# Patient Record
Sex: Male | Born: 1970 | Hispanic: Yes | Marital: Single | Smoking: Former smoker
Health system: Southern US, Community
[De-identification: ages and names within clinical notes are randomized; demographics above are authoritative.]

---

## 2013-12-31 ENCOUNTER — Ambulatory Visit: Payer: Self-pay | Admitting: Family Medicine

## 2013-12-31 VITALS — BP 128/74 | HR 82 | Temp 98.7°F | Resp 16 | Ht 66.0 in | Wt 125.0 lb

## 2013-12-31 DIAGNOSIS — H109 Unspecified conjunctivitis: Secondary | ICD-10-CM

## 2013-12-31 DIAGNOSIS — H579 Unspecified disorder of eye and adnexa: Secondary | ICD-10-CM

## 2013-12-31 DIAGNOSIS — H5789 Other specified disorders of eye and adnexa: Secondary | ICD-10-CM

## 2013-12-31 MED ORDER — OFLOXACIN 0.3 % OP SOLN: 2.0000 [drp] | Freq: Four times a day (QID) | OPHTHALMIC | Status: DC

## 2013-12-31 NOTE — Progress Notes (Signed)
      Chief Complaint:  Chief Complaint  Patient presents with  . eye redness    2 days     HPI: Dan Mann is a 43 y.o. male who is here for  2 day history of right eye redness and pain in the upper right area of eye near the nasal bridge, something flew in his eye after sorting out trash. NO protective eye gear. Small debris may have gotten in his eye, he is not sure.  He has some irritation and redness but no visions changes or excruciating pain, just discomfort. He has had no fevers. No chills. Has not tried anythign for this. No prior eye injuries. Works in  [employer] . Denies rashes or facial pain. Dan Mann has been clear. No swelling.   History reviewed. No pertinent past medical history. History reviewed. No pertinent past surgical history. History   Social History  . Marital Status: Married    Spouse Name: N/A    Number of Children: N/A  . Years of Education: N/A   Social History Main Topics  . Smoking status: Never Smoker   . Smokeless tobacco: None  . Alcohol Use: No  . Drug Use: No  . Sexual Activity: None   Other Topics Concern  . None   Social History Narrative  . None   History reviewed. No pertinent family history. No Known Allergies Prior to Admission medications   Not on File     ROS: The patient denies fevers, chills, night sweats, unintentional weight loss, chest pain, palpitations, wheezing, dyspnea on exertion, nausea, vomiting, abdominal pain, dysuria, hematuria, melena, numbness, weakness, or tingling.   All other systems have been reviewed and were otherwise negative with the exception of those mentioned in the HPI and as above.    PHYSICAL EXAM: Filed Vitals:   12/31/13 1547  BP: 128/74  Pulse: 82  Temp: 98.7 F (37.1 C)  Resp: 16   Filed Vitals:   12/31/13 1547  Height: 5\' 6"  (1.676 m)  Weight: 125 lb (56.7 kg)   Body mass index is 20.19 kg/(m^2).  General: Alert, no acute distress HEENT:  Normocephalic,  atraumatic, oropharynx patent. EOMI, PERRLA. Fundoscopic and fluroscein exam was normal . + red conjunctiva Cardiovascular:  Regular rate and rhythm, no rubs murmurs or gallops.  No Carotid bruits, radial pulse intact. No pedal edema.  Respiratory: Clear to auscultation bilaterally.  No wheezes, rales, or rhonchi.  No cyanosis, no use of accessory musculature GI: No organomegaly, abdomen is soft and non-tender, positive bowel sounds.  No masses. Skin: No rashes. Neurologic: Facial musculature symmetric. Psychiatric: Patient is appropriate throughout our interaction. Lymphatic: No cervical lymphadenopathy Musculoskeletal: Gait intact.   LABS: No results found for this or any previous visit.   EKG/XRAY:   Primary read [facility]   ASSESSMENT/PLAN: Encounter Diagnoses  Name Primary?  . Irritation of right eye Yes  . Conjunctivitis    Rx ocuflox QID  X 7 days Eye was flushed with sterile saline, given ocuflox and 2 drops of prednisone gtt in office.  Return if worsening sxs Fundoscopic and fluroscein exam was normal   Gross sideeffects, risk and benefits, and alternatives of medications d/w patient. Patient is aware that all medications have potential sideeffects and we are unable to predict every sideeffect or drug-drug interaction that may occur.  Dan Mann, Dan Lillibridge PHUONG, DO 12/31/2013 5:15 PM

## 2014-07-10 ENCOUNTER — Ambulatory Visit: Payer: No Typology Code available for payment source | Attending: Family Medicine | Admitting: Family Medicine

## 2014-07-10 ENCOUNTER — Encounter: Payer: Self-pay | Admitting: Family Medicine

## 2014-07-10 VITALS — BP 112/70 | HR 68 | Temp 98.2°F | Resp 16 | Ht 63.0 in | Wt 128.0 lb

## 2014-07-10 DIAGNOSIS — Z7289 Other problems related to lifestyle: Secondary | ICD-10-CM

## 2014-07-10 DIAGNOSIS — Z789 Other specified health status: Secondary | ICD-10-CM | POA: Insufficient documentation

## 2014-07-10 DIAGNOSIS — Z23 Encounter for immunization: Secondary | ICD-10-CM

## 2014-07-10 DIAGNOSIS — K029 Dental caries, unspecified: Secondary | ICD-10-CM

## 2014-07-10 DIAGNOSIS — Z833 Family history of diabetes mellitus: Secondary | ICD-10-CM

## 2014-07-10 DIAGNOSIS — Z0000 Encounter for general adult medical examination without abnormal findings: Secondary | ICD-10-CM | POA: Insufficient documentation

## 2014-07-10 DIAGNOSIS — A64 Unspecified sexually transmitted disease: Secondary | ICD-10-CM

## 2014-07-10 HISTORY — DX: Family history of diabetes mellitus: Z83.3

## 2014-07-10 LAB — COMPLETE METABOLIC PANEL WITH GFR
ALBUMIN: 4.5 g/dL (ref 3.5–5.2)
ALK PHOS: 76 U/L (ref 39–117)
ALT: 14 U/L (ref 0–53)
AST: 15 U/L (ref 0–37)
BILIRUBIN TOTAL: 0.4 mg/dL (ref 0.2–1.2)
BUN: 19 mg/dL (ref 6–23)
CO2: 29 mEq/L (ref 19–32)
Calcium: 9.6 mg/dL (ref 8.4–10.5)
Chloride: 101 mEq/L (ref 96–112)
Creat: 0.82 mg/dL (ref 0.50–1.35)
GFR, Est African American: >89 mL/min
Glucose, Bld: 81 mg/dL (ref 70–99)
POTASSIUM: 4.8 meq/L (ref 3.5–5.3)
SODIUM: 137 meq/L (ref 135–145)
Total Protein: 7.6 g/dL (ref 6.0–8.3)

## 2014-07-10 LAB — CBC
HEMATOCRIT: 45 % (ref 39.0–52.0)
HEMOGLOBIN: 15.9 g/dL (ref 13.0–17.0)
MCH: 29.3 pg (ref 26.0–34.0)
MCHC: 35.3 g/dL (ref 30.0–36.0)
MCV: 83 fL (ref 78.0–100.0)
Platelets: 226 10*3/uL (ref 150–400)
RBC: 5.42 MIL/uL (ref 4.22–5.81)
RDW: 14 % (ref 11.5–15.5)
WBC: 7 10*3/uL (ref 4.0–10.5)

## 2014-07-10 NOTE — Assessment & Plan Note (Signed)
A: excessive in take of beer P: Recommend cutting down Check CMP and CBC

## 2014-07-10 NOTE — Assessment & Plan Note (Signed)
Poor dentition with cavities Dental referral placed

## 2014-07-10 NOTE — Progress Notes (Signed)
   Subjective:    Patient ID: Dan Mann, male    DOB: 02-15-1971, 43 y.o.   MRN: [id number] CC: establish care, no complaints.  HPI 43 year old male presents to establish care and for a physical.  Complaints: none   Social history: former smoker  Review of Systems General:  Negative for unexplained weight loss, fever Skin: Negative for new or changing mole, sore that won't heal HEENT: Negative for trouble hearing, trouble seeing, ringing in ears, mouth sores, hoarseness, change in voice, dysphagia. CV:  Negative for chest pain, dyspnea, edema, palpitations Resp: Negative for cough, dyspnea, hemoptysis GI: Negative for nausea, vomiting, diarrhea, constipation, abdominal pain, melena, hematochezia. GU: Negative for dysuria, incontinence, urinary hesitance, hematuria, vaginal or penile discharge, polyuria, sexual difficulty, lumps in testicle or breasts MSK: Positive for muscle aches in back and upper chest occasionally.  Negative for muscle cramps , joint pain or swelling Neuro: Negative for headaches, weakness, numbness, dizziness, passing out/fainting Psych: Negative for depression, anxiety, memory problems    Objective:   Physical Exam BP 112/70  Pulse 68  Temp(Src) 98.2 F (36.8 C) (Oral)  Resp 16  Ht  (1.6 m)  Wt 128 lb (58.06 kg)  BMI 22.68 kg/m2  SpO2 100%  General Appearance:    Alert, cooperative, no distress, appears stated age  Head:    Normocephalic, without obvious abnormality, atraumatic  Eyes:    PERRL, conjunctiva/corneas clear, EOM's intact,     both eyes       Ears:    Normal TM's and external ear canals, both ears  Nose:   Nares normal, septum midline, mucosa normal, no drainage   or sinus tenderness  Throat:   Poor dentition with carries.  Lips, mucosa, and tongue normal; gums normal  Neck:   Supple, symmetrical, trachea midline, no adenopathy;       thyroid:  No enlargement/tenderness/nodules; no carotid   bruit or JVD  Back:      Symmetric, no curvature, ROM normal, no CVA tenderness  Lungs:     Clear to auscultation bilaterally, respirations unlabored  Chest wall:    No tenderness or deformity  Heart:    Regular rate and rhythm, S1 and S2 normal, no murmur, rub   or gallop  Abdomen:     Soft, non-tender, bowel sounds active all four quadrants,    no masses, no organomegaly  Genitalia:    Normal male without lesion, discharge or tenderness. Circumcised.   Rectal:    Deferred   Extremities:   Extremities normal, atraumatic, no cyanosis or edema  Pulses:   2+ and symmetric all extremities  Skin:   Skin color, texture, turgor normal, no rashes or lesions  Lymph nodes:   Cervical, supraclavicular, and axillary nodes normal  Neurologic:   CNII-XII intact. Normal strength, sensation and reflexes      throughout      Assessment & Plan:

## 2014-07-10 NOTE — Assessment & Plan Note (Signed)
CMP for screening blood sugar

## 2014-07-10 NOTE — Patient Instructions (Signed)
Mr. Noland, Pizano por venido hoy.  Thank you for coming in today. It was a pleasure meeting you. I look forward to be a primary doctor.   Your exam is normal. You will be called with lab results.  I recommend a physical every 1-2 years.  Call and come in sooner if needed.  Dr. Armen Pickup

## 2014-07-10 NOTE — Assessment & Plan Note (Signed)
One time HIV

## 2014-07-10 NOTE — Progress Notes (Signed)
Establish Care;

## 2014-07-11 LAB — HIV ANTIBODY (ROUTINE TESTING W REFLEX): HIV: NONREACTIVE

## 2014-07-12 ENCOUNTER — Telehealth: Payer: Self-pay | Admitting: *Deleted

## 2014-07-12 NOTE — Telephone Encounter (Signed)
Left message with male to return call 

## 2014-07-12 NOTE — Telephone Encounter (Signed)
Message copied by Dyann Kief on Fri Jul 12, 2014  2:40 PM ------      Message from: Dessa Phi      Created: Thu Jul 11, 2014  4:10 PM       Screening HIV negative. Normal CBC and CMP. ------

## 2014-07-12 NOTE — Telephone Encounter (Signed)
Message copied by Dyann Kief on Fri Jul 12, 2014  2:35 PM ------      Message from: Dessa Phi      Created: Thu Jul 11, 2014  4:10 PM       Screening HIV negative. Normal CBC and CMP. ------

## 2014-07-16 ENCOUNTER — Telehealth: Payer: Self-pay | Admitting: Emergency Medicine

## 2014-07-16 ENCOUNTER — Telehealth: Payer: Self-pay | Admitting: Family Medicine

## 2014-07-16 NOTE — Telephone Encounter (Signed)
Pt given lab results in person with print out per Spanish intepretor

## 2014-12-13 ENCOUNTER — Ambulatory Visit: Payer: Self-pay

## 2015-05-15 ENCOUNTER — Encounter (HOSPITAL_COMMUNITY): Payer: Self-pay | Admitting: Emergency Medicine

## 2015-05-15 DIAGNOSIS — K353 Acute appendicitis with localized peritonitis: Secondary | ICD-10-CM | POA: Diagnosis present

## 2015-05-15 DIAGNOSIS — K3580 Unspecified acute appendicitis: Principal | ICD-10-CM | POA: Insufficient documentation

## 2015-05-15 LAB — CBC WITH DIFFERENTIAL/PLATELET
BASOS PCT: 0 % (ref 0–1)
Basophils Absolute: 0 10*3/uL (ref 0.0–0.1)
Eosinophils Absolute: 0 10*3/uL (ref 0.0–0.7)
Eosinophils Relative: 0 % (ref 0–5)
HEMATOCRIT: 40.2 % (ref 39.0–52.0)
Hemoglobin: 14.1 g/dL (ref 13.0–17.0)
Lymphocytes Relative: 12 % (ref 12–46)
Lymphs Abs: 1.5 10*3/uL (ref 0.7–4.0)
MCH: 28.7 pg (ref 26.0–34.0)
MCHC: 35.1 g/dL (ref 30.0–36.0)
MCV: 81.9 fL (ref 78.0–100.0)
MONO ABS: 0.9 10*3/uL (ref 0.1–1.0)
Monocytes Relative: 8 % (ref 3–12)
NEUTROS PCT: 80 % — AB (ref 43–77)
Neutro Abs: 9.8 10*3/uL — ABNORMAL HIGH (ref 1.7–7.7)
Platelets: 190 10*3/uL (ref 150–400)
RBC: 4.91 MIL/uL (ref 4.22–5.81)
RDW: 12.9 % (ref 11.5–15.5)
WBC: 12.2 10*3/uL — ABNORMAL HIGH (ref 4.0–10.5)

## 2015-05-15 LAB — I-STAT CHEM 8, ED
BUN: 18 mg/dL (ref 6–20)
CHLORIDE: 99 mmol/L — AB (ref 101–111)
Calcium, Ion: 1.14 mmol/L (ref 1.12–1.23)
Creatinine, Ser: 1.1 mg/dL (ref 0.61–1.24)
GLUCOSE: 93 mg/dL (ref 65–99)
HCT: 44 % (ref 39.0–52.0)
HEMOGLOBIN: 15 g/dL (ref 13.0–17.0)
Potassium: 3.4 mmol/L — ABNORMAL LOW (ref 3.5–5.1)
Sodium: 139 mmol/L (ref 135–145)
TCO2: 26 mmol/L (ref 0–100)

## 2015-05-15 NOTE — ED Notes (Signed)
Pt c/o pain at umbilicus, onset this am with nausea and vomiting.  Denies diarrhea

## 2015-05-16 ENCOUNTER — Emergency Department (HOSPITAL_COMMUNITY): Payer: Medicaid Other

## 2015-05-16 ENCOUNTER — Encounter (HOSPITAL_COMMUNITY): Payer: Self-pay | Admitting: *Deleted

## 2015-05-16 ENCOUNTER — Encounter (HOSPITAL_COMMUNITY)
Admission: EM | Disposition: A | Discharge status: Home / Self Care | Payer: Self-pay | Source: Home / Self Care | Attending: Emergency Medicine

## 2015-05-16 ENCOUNTER — Observation Stay (HOSPITAL_COMMUNITY)
Admission: EM | Admit: 2015-05-16 | Discharge: 2015-05-17 | Disposition: A | Discharge status: Home / Self Care | Payer: Medicaid Other | Attending: Surgery | Admitting: Surgery

## 2015-05-16 ENCOUNTER — Ambulatory Visit: Payer: Self-pay | Admitting: Family Medicine

## 2015-05-16 ENCOUNTER — Observation Stay (HOSPITAL_COMMUNITY): Payer: Medicaid Other | Admitting: Anesthesiology

## 2015-05-16 DIAGNOSIS — K3580 Unspecified acute appendicitis: Secondary | ICD-10-CM | POA: Diagnosis present

## 2015-05-16 DIAGNOSIS — K353 Acute appendicitis with localized peritonitis, without perforation or gangrene: Secondary | ICD-10-CM

## 2015-05-16 HISTORY — PX: LAPAROSCOPIC APPENDECTOMY: SHX408

## 2015-05-16 LAB — URINALYSIS, ROUTINE W REFLEX MICROSCOPIC
Bilirubin Urine: NEGATIVE
GLUCOSE, UA: NEGATIVE mg/dL
Hgb urine dipstick: NEGATIVE
Ketones, ur: NEGATIVE mg/dL
Leukocytes, UA: NEGATIVE
NITRITE: NEGATIVE
PROTEIN: NEGATIVE mg/dL
SPECIFIC GRAVITY, URINE: 1.013 (ref 1.005–1.030)
UROBILINOGEN UA: 0.2 mg/dL (ref 0.0–1.0)
pH: 6 (ref 5.0–8.0)

## 2015-05-16 LAB — SURGICAL PCR SCREEN
MRSA, PCR: NEGATIVE
Staphylococcus aureus: POSITIVE — AB

## 2015-05-16 SURGERY — APPENDECTOMY, LAPAROSCOPIC
Anesthesia: General | Site: Abdomen

## 2015-05-16 MED ORDER — LIDOCAINE HCL (CARDIAC) 20 MG/ML IV SOLN
INTRAVENOUS | Status: DC | PRN
  Administered 2015-05-16: 100 mg via INTRAVENOUS

## 2015-05-16 MED ORDER — MORPHINE SULFATE 2 MG/ML IJ SOLN
1.0000 mg | INTRAMUSCULAR | Status: DC | PRN
  Administered 2015-05-16: 2 mg via INTRAVENOUS
  Administered 2015-05-16: 4 mg via INTRAVENOUS
  Filled 2015-05-16: qty 2
  Filled 2015-05-16: qty 1

## 2015-05-16 MED ORDER — ONDANSETRON HCL 4 MG/2ML IJ SOLN
4.0000 mg | Freq: Four times a day (QID) | INTRAMUSCULAR | Status: DC | PRN
  Filled 2015-05-16 (×2): qty 2

## 2015-05-16 MED ORDER — SUCCINYLCHOLINE CHLORIDE 20 MG/ML IJ SOLN
INTRAMUSCULAR | Status: DC | PRN
  Administered 2015-05-16: 160 mg via INTRAVENOUS

## 2015-05-16 MED ORDER — NEOSTIGMINE METHYLSULFATE 10 MG/10ML IV SOLN
INTRAVENOUS | Status: DC | PRN
  Administered 2015-05-16: 3 mg via INTRAVENOUS

## 2015-05-16 MED ORDER — FENTANYL CITRATE (PF) 100 MCG/2ML IJ SOLN
INTRAMUSCULAR | Status: DC | PRN
  Administered 2015-05-16: 100 ug via INTRAVENOUS

## 2015-05-16 MED ORDER — ROCURONIUM BROMIDE 50 MG/5ML IV SOLN
INTRAVENOUS | Status: AC
  Filled 2015-05-16: qty 1

## 2015-05-16 MED ORDER — PROPOFOL 10 MG/ML IV BOLUS
INTRAVENOUS | Status: DC | PRN
  Administered 2015-05-16: 180 mg via INTRAVENOUS

## 2015-05-16 MED ORDER — BETHANECHOL CHLORIDE 25 MG PO TABS
25.0000 mg | ORAL_TABLET | Freq: Four times a day (QID) | ORAL | Status: DC
  Administered 2015-05-16 – 2015-05-17 (×4): 25 mg via ORAL
  Filled 2015-05-16 (×4): qty 1

## 2015-05-16 MED ORDER — PHENYLEPHRINE HCL 10 MG/ML IJ SOLN
INTRAMUSCULAR | Status: DC | PRN
  Administered 2015-05-16 (×4): 40 ug via INTRAVENOUS

## 2015-05-16 MED ORDER — MIDAZOLAM HCL 2 MG/2ML IJ SOLN
INTRAMUSCULAR | Status: AC
  Filled 2015-05-16: qty 2

## 2015-05-16 MED ORDER — POTASSIUM CHLORIDE IN NACL 20-0.9 MEQ/L-% IV SOLN
INTRAVENOUS | Status: DC
  Administered 2015-05-16 (×2): via INTRAVENOUS
  Filled 2015-05-16: qty 2000

## 2015-05-16 MED ORDER — FENTANYL CITRATE (PF) 250 MCG/5ML IJ SOLN
INTRAMUSCULAR | Status: AC
  Filled 2015-05-16: qty 5

## 2015-05-16 MED ORDER — BUPIVACAINE HCL 0.25 % IJ SOLN
INTRAMUSCULAR | Status: DC | PRN
  Administered 2015-05-16: 5 mL

## 2015-05-16 MED ORDER — 0.9 % SODIUM CHLORIDE (POUR BTL) OPTIME
TOPICAL | Status: DC | PRN
  Administered 2015-05-16: 1000 mL

## 2015-05-16 MED ORDER — LIDOCAINE HCL (CARDIAC) 20 MG/ML IV SOLN
INTRAVENOUS | Status: AC
  Filled 2015-05-16: qty 5

## 2015-05-16 MED ORDER — OXYCODONE-ACETAMINOPHEN 5-325 MG PO TABS: 1.0000 | ORAL_TABLET | ORAL | Status: DC | PRN

## 2015-05-16 MED ORDER — ONDANSETRON HCL 4 MG PO TABS: 4.0000 mg | ORAL_TABLET | Freq: Four times a day (QID) | ORAL | Status: DC | PRN

## 2015-05-16 MED ORDER — SODIUM CHLORIDE 0.9 % IV BOLUS (SEPSIS)
1000.0000 mL | Freq: Once | INTRAVENOUS | Status: AC
  Administered 2015-05-16: 1000 mL via INTRAVENOUS

## 2015-05-16 MED ORDER — PIPERACILLIN-TAZOBACTAM 3.375 G IVPB
3.3750 g | Freq: Three times a day (TID) | INTRAVENOUS | Status: DC
  Administered 2015-05-16: 3.375 g via INTRAVENOUS
  Filled 2015-05-16 (×3): qty 50

## 2015-05-16 MED ORDER — GLYCOPYRROLATE 0.2 MG/ML IJ SOLN
INTRAMUSCULAR | Status: AC
  Filled 2015-05-16: qty 2

## 2015-05-16 MED ORDER — MORPHINE SULFATE 4 MG/ML IJ SOLN
4.0000 mg | Freq: Once | INTRAMUSCULAR | Status: AC
  Administered 2015-05-16: 4 mg via INTRAVENOUS
  Filled 2015-05-16: qty 1

## 2015-05-16 MED ORDER — LACTATED RINGERS IV SOLN
INTRAVENOUS | Status: DC | PRN
  Administered 2015-05-16 (×2): via INTRAVENOUS

## 2015-05-16 MED ORDER — POTASSIUM CHLORIDE IN NACL 20-0.9 MEQ/L-% IV SOLN
INTRAVENOUS | Status: DC
  Administered 2015-05-16: 125 mL/h via INTRAVENOUS
  Filled 2015-05-16: qty 1000

## 2015-05-16 MED ORDER — IOHEXOL 300 MG/ML  SOLN
100.0000 mL | Freq: Once | INTRAMUSCULAR | Status: AC | PRN
Start: 2015-05-16 — End: 2015-05-16
  Administered 2015-05-16: 100 mL via INTRAVENOUS

## 2015-05-16 MED ORDER — ONDANSETRON HCL 4 MG/2ML IJ SOLN
INTRAMUSCULAR | Status: DC | PRN
  Administered 2015-05-16: 4 mg via INTRAVENOUS

## 2015-05-16 MED ORDER — SODIUM CHLORIDE 0.9 % IR SOLN
Status: DC | PRN
  Administered 2015-05-16: 1000 mL

## 2015-05-16 MED ORDER — ONDANSETRON HCL 4 MG/2ML IJ SOLN
4.0000 mg | Freq: Four times a day (QID) | INTRAMUSCULAR | Status: DC | PRN
  Administered 2015-05-16 – 2015-05-17 (×2): 4 mg via INTRAVENOUS

## 2015-05-16 MED ORDER — BUPIVACAINE HCL (PF) 0.25 % IJ SOLN
INTRAMUSCULAR | Status: AC
  Filled 2015-05-16: qty 30

## 2015-05-16 MED ORDER — GLYCOPYRROLATE 0.2 MG/ML IJ SOLN
INTRAMUSCULAR | Status: DC | PRN
  Administered 2015-05-16: 0.4 mg via INTRAVENOUS

## 2015-05-16 MED ORDER — OXYCODONE-ACETAMINOPHEN 5-325 MG PO TABS
1.0000 | ORAL_TABLET | ORAL | Status: DC | PRN
  Administered 2015-05-16 – 2015-05-17 (×3): 2 via ORAL
  Filled 2015-05-16 (×3): qty 2

## 2015-05-16 MED ORDER — PROMETHAZINE HCL 25 MG/ML IJ SOLN
6.2500 mg | INTRAMUSCULAR | Status: DC | PRN
Start: 2015-05-16 — End: 2015-05-16

## 2015-05-16 MED ORDER — ROCURONIUM BROMIDE 100 MG/10ML IV SOLN
INTRAVENOUS | Status: DC | PRN
  Administered 2015-05-16: 5 mg via INTRAVENOUS
  Administered 2015-05-16 (×2): 10 mg via INTRAVENOUS

## 2015-05-16 MED ORDER — MIDAZOLAM HCL 5 MG/5ML IJ SOLN
INTRAMUSCULAR | Status: DC | PRN
  Administered 2015-05-16: 1 mg via INTRAVENOUS

## 2015-05-16 MED ORDER — TAMSULOSIN HCL 0.4 MG PO CAPS
0.8000 mg | ORAL_CAPSULE | Freq: Every day | ORAL | Status: DC
  Administered 2015-05-17: 0.8 mg via ORAL
  Filled 2015-05-16: qty 2

## 2015-05-16 MED ORDER — SUCCINYLCHOLINE CHLORIDE 20 MG/ML IJ SOLN
INTRAMUSCULAR | Status: AC
  Filled 2015-05-16: qty 1

## 2015-05-16 MED ORDER — HYDROMORPHONE HCL 1 MG/ML IJ SOLN
INTRAMUSCULAR | Status: AC
  Filled 2015-05-16: qty 1

## 2015-05-16 MED ORDER — HYDROMORPHONE HCL 1 MG/ML IJ SOLN
0.2500 mg | INTRAMUSCULAR | Status: DC | PRN
  Administered 2015-05-16: 0.5 mg via INTRAVENOUS

## 2015-05-16 MED ORDER — PROPOFOL 10 MG/ML IV BOLUS
INTRAVENOUS | Status: AC
  Filled 2015-05-16: qty 20

## 2015-05-16 SURGICAL SUPPLY — 44 items
APPLIER CLIP 5 13 M/L LIGAMAX5 (MISCELLANEOUS)
BENZOIN TINCTURE PRP APPL 2/3 (GAUZE/BANDAGES/DRESSINGS) ×3 IMPLANT
BLADE SURG ROTATE 9660 (MISCELLANEOUS) ×3 IMPLANT
CANISTER SUCTION 2500CC (MISCELLANEOUS) ×3 IMPLANT
CHLORAPREP W/TINT 26ML (MISCELLANEOUS) ×3 IMPLANT
CLIP APPLIE 5 13 M/L LIGAMAX5 (MISCELLANEOUS) IMPLANT
CLOSURE WOUND 1/2 X4 (GAUZE/BANDAGES/DRESSINGS) ×1
COVER SURGICAL LIGHT HANDLE (MISCELLANEOUS) ×3 IMPLANT
COVER TRANSDUCER ULTRASND (DRAPES) ×3 IMPLANT
DEVICE TROCAR PUNCTURE CLOSURE (ENDOMECHANICALS) ×3 IMPLANT
ELECT REM PT RETURN 9FT ADLT (ELECTROSURGICAL) ×3
ELECTRODE REM PT RTRN 9FT ADLT (ELECTROSURGICAL) ×1 IMPLANT
ENDOLOOP SUT PDS II  0 18 (SUTURE) ×6
ENDOLOOP SUT PDS II 0 18 (SUTURE) ×3 IMPLANT
GAUZE SPONGE 2X2 8PLY STRL LF (GAUZE/BANDAGES/DRESSINGS) ×1 IMPLANT
GLOVE BIO SURGEON STRL SZ7.5 (GLOVE) ×3 IMPLANT
GLOVE BIOGEL PI IND STRL 6.5 (GLOVE) ×1 IMPLANT
GLOVE BIOGEL PI INDICATOR 6.5 (GLOVE) ×2
GOWN STRL REUS W/ TWL LRG LVL3 (GOWN DISPOSABLE) ×2 IMPLANT
GOWN STRL REUS W/ TWL XL LVL3 (GOWN DISPOSABLE) ×1 IMPLANT
GOWN STRL REUS W/TWL LRG LVL3 (GOWN DISPOSABLE) ×4
GOWN STRL REUS W/TWL XL LVL3 (GOWN DISPOSABLE) ×2
KIT BASIN OR (CUSTOM PROCEDURE TRAY) ×3 IMPLANT
KIT ROOM TURNOVER OR (KITS) ×3 IMPLANT
NEEDLE INSUFFLATION 14GA 120MM (NEEDLE) ×3 IMPLANT
NS IRRIG 1000ML POUR BTL (IV SOLUTION) ×3 IMPLANT
PAD ARMBOARD 7.5X6 YLW CONV (MISCELLANEOUS) ×3 IMPLANT
POUCH RETRIEVAL ECOSAC 10 (ENDOMECHANICALS) ×1 IMPLANT
POUCH RETRIEVAL ECOSAC 10MM (ENDOMECHANICALS) ×2
SCISSORS LAP 5X35 DISP (ENDOMECHANICALS) ×3 IMPLANT
SET IRRIG TUBING LAPAROSCOPIC (IRRIGATION / IRRIGATOR) ×3 IMPLANT
SLEEVE ENDOPATH XCEL 5M (ENDOMECHANICALS) ×3 IMPLANT
SPECIMEN JAR SMALL (MISCELLANEOUS) ×3 IMPLANT
SPONGE GAUZE 2X2 STER 10/PKG (GAUZE/BANDAGES/DRESSINGS) ×2
STRIP CLOSURE SKIN 1/2X4 (GAUZE/BANDAGES/DRESSINGS) ×2 IMPLANT
SUT MNCRL AB 3-0 PS2 18 (SUTURE) ×3 IMPLANT
SUT SILK 2 0 SH (SUTURE) IMPLANT
TOWEL OR 17X24 6PK STRL BLUE (TOWEL DISPOSABLE) ×3 IMPLANT
TRAY FOLEY CATH 16FR SILVER (SET/KITS/TRAYS/PACK) ×3 IMPLANT
TRAY FOLEY CATH SILVER 16FR (SET/KITS/TRAYS/PACK) ×2 IMPLANT
TRAY LAPAROSCOPIC MC (CUSTOM PROCEDURE TRAY) ×3 IMPLANT
TROCAR XCEL NON-BLD 11X100MML (ENDOMECHANICALS) ×3 IMPLANT
TROCAR XCEL NON-BLD 5MMX100MML (ENDOMECHANICALS) ×3 IMPLANT
TUBING INSUFFLATION (TUBING) ×3 IMPLANT

## 2015-05-16 NOTE — Transfer of Care (Signed)
Immediate Anesthesia Transfer of Care Note  Patient: Dan Mann  Procedure(s) Performed: Procedure(s): APPENDECTOMY LAPAROSCOPIC (N/A)  Patient Location: PACU  Anesthesia Type:General  Level of Consciousness: awake, alert , oriented and patient cooperative  Airway & Oxygen Therapy: Patient Spontanous Breathing and Patient connected to face mask oxygen  Post-op Assessment: Report given to RN, Post -op Vital signs reviewed and stable and Patient moving all extremities  Post vital signs: Reviewed and stable  Last Vitals:  Filed Vitals:   05/16/15 0656  BP: 124/79  Pulse: 66  Temp: 36.4 C  Resp: 16    Complications: No apparent anesthesia complications

## 2015-05-16 NOTE — Anesthesia Preprocedure Evaluation (Addendum)
Anesthesia Evaluation  Patient identified by MRN, date of birth, ID band Patient awake    Reviewed: Allergy & Precautions, NPO status , Patient's Chart, lab work & pertinent test results  History of Anesthesia Complications Negative for: history of anesthetic complications  Airway Mallampati: II  TM Distance: >3 FB Neck ROM: Full    Dental  (+) Poor Dentition, Dental Advisory Given   Pulmonary former smoker,    Pulmonary exam normal       Cardiovascular negative cardio ROS Normal cardiovascular exam    Neuro/Psych negative neurological ROS  negative psych ROS   GI/Hepatic Neg liver ROS,   Endo/Other    Renal/GU negative Renal ROS     Musculoskeletal   Abdominal   Peds  Hematology   Anesthesia Other Findings   Reproductive/Obstetrics                           Anesthesia Physical Anesthesia Plan  ASA: II  Anesthesia Plan: General   Post-op Pain Management:    Induction: Intravenous  Airway Management Planned: Oral ETT  Additional Equipment:   Intra-op Plan:   Post-operative Plan: Extubation in OR  Informed Consent: I have reviewed the patients History and Physical, chart, labs and discussed the procedure including the risks, benefits and alternatives for the proposed anesthesia with the patient or authorized representative who has indicated his/her understanding and acceptance.   Dental advisory given  Plan Discussed with: CRNA, Anesthesiologist and Surgeon  Anesthesia Plan Comments:        Anesthesia Quick Evaluation

## 2015-05-16 NOTE — Op Note (Signed)
05/16/2015  8:14 AM  PATIENT:  Dan Mann  44 y.o. male  PRE-OPERATIVE DIAGNOSIS:  acute appendicitis  POST-OPERATIVE DIAGNOSIS:  Acute non perforated appendicitis  PROCEDURE:  Procedure(s): APPENDECTOMY LAPAROSCOPIC (N/A)  SURGEON:  Surgeon(s) and Role:    * Axel FillerArmando Porter Nakama, MD - Primary  ANESTHESIA:   local and general  EBL:   <5cc  BLOOD ADMINISTERED:none  DRAINS: none   LOCAL MEDICATIONS USED:  BUPIVICAINE   SPECIMEN:  Source of Specimen:  appendix  DISPOSITION OF SPECIMEN:  PATHOLOGY  COUNTS:  YES  TOURNIQUET:  * No tourniquets in log *  DICTATION: .Dragon Dictation Complications: none  Counts: reported as correct x 2  Findings:  The patient had an acutely inflamed nonperforated appendix  Specimen: Appendix  Indications for procedure:  The patient is a 44 year old male with a history of periumbilical pain localized in the right lower quadrant patient had a CT scan which revealed signs consistent with acute appendicitis the patient back in for laparoscopic appendectomy.  Details of the procedure:The patient was taken back to the operating room. The patient was placed in supine position with bilateral SCDs in place. The patient was prepped and draped in the usual sterile fashion.  After appropriate anitbiotics were confirmed, a time-out was confirmed and all facts were verified.  A pneumoperitoneum of 14 mmHg was obtained via a Veress needle technique in the left lower quadrant quadrant.  A 5 mm trocar and 5 mm camera then placed intra-abdominally there is no injury to any intra-abdominal organs a 10 mm infraumbilical port was placed and direct visualization as was a 5 mm port in the suprapubic area. The appendix was identified  The appendix identified and cleaned down to the appendiceal base. The appendiceal artery was coagulated with electrocautery, the mesoappendix was then incised and cleared away.  The the appendiceal base was clean.  At this time an  Endoloop was placed proximallyx2 and one distally and the appendix was transected between these 2. An EcoSac was then placed into the abdomen and the specimen placed in the bag. The appendiceal stump was cauterized. We evacuate the fluid from the pelvis until the effluent was clear. The omentum was brought over the appendiceal stump. The appendix a latex retrieval  bag was then retrieved via the supraumbilical port. #1 Vicryl was used to reapproximate the fascia at the umbilical port site x2. The skin was reapproximated all port sites 3-0 Monocryl subcuticular fashion. The skin was dressed with Steri-Strips gauze and tape. The patient was awakened from general anesthesia was taken to recovery room in stable condition.    PLAN OF CARE: Admit for overnight observation  PATIENT DISPOSITION:  PACU - hemodynamically stable.   Delay start of Pharmacological VTE agent (>24hrs) due to surgical blood loss or risk of bleeding: not applicable

## 2015-05-16 NOTE — ED Notes (Signed)
Pt unable to produce urine at the time.

## 2015-05-16 NOTE — ED Notes (Signed)
Report given to RN on 6N

## 2015-05-16 NOTE — Anesthesia Postprocedure Evaluation (Signed)
Anesthesia Post Note  Patient: Dan Mann  Procedure(s) Performed: Procedure(s) (LRB): APPENDECTOMY LAPAROSCOPIC (N/A)  Anesthesia type: general  Patient location: PACU  Post pain: Pain level controlled  Post assessment: Patient's Cardiovascular Status Stable  Last Vitals:  Filed Vitals:   05/16/15 0931  BP:   Pulse: 67  Temp:   Resp: 8    Post vital signs: Reviewed and stable  Level of consciousness: sedated  Complications: No apparent anesthesia complications

## 2015-05-16 NOTE — ED Provider Notes (Signed)
CSN: [id number]     Arrival date & time 05/15/15  2024 History   This chart was scribed for Derwood Kaplan, MD by Arlan Organ, ED Scribe. This patient was seen in room A08C/A08C and the patient's care was started 1:37 AM.   Chief Complaint  Patient presents with  . Abdominal Pain   The history is provided by the patient. No language interpreter was used.    HPI Comments: Dan Mann is a 44 y.o. male without any pertinent past medical history who presents to the Emergency Department complaining of intermittent, non-radiating, unchanged abdominal pain onset early this morning. Episodes typically last 1-2 minutes at a time described as stabbing. Pain is exacerbated with movement and mildly alleviated when siting still. Currently he rates this pain 5/10. No previously history of similar pain. No OTC medications or home remedies attempted prior to arrival. No fever, chills, dysuria, or hematuria. He denies any history of abdominal surgeries. No recent long distance travel. No known allergies to medications.  History reviewed. No pertinent past medical history. No past surgical history on file. Family History  Problem Relation Age of Onset  . Diabetes Mother   . Hypertension Mother   . Cancer Neg Hx   . Heart disease Neg Hx    History  Substance Use Topics  . Smoking status: Former Smoker -- 0.25 packs/day for .5 years    Types: Cigarettes    Quit date: 07/10/1994  . Smokeless tobacco: Never Used  . Alcohol Use: 25.2 oz/week    42 Cans of beer per week     Comment: 4-6 beers a day     Review of Systems  Constitutional: Negative for fever and chills.  Respiratory: Negative for cough and shortness of breath.   Cardiovascular: Negative for chest pain.  Gastrointestinal: Positive for abdominal pain.  Genitourinary: Negative for dysuria and hematuria.  Neurological: Negative for weakness and numbness.  Psychiatric/Behavioral: Negative for confusion.  All other systems  reviewed and are negative.     Allergies  Review of patient's allergies indicates no known allergies.  Home Medications   Prior to Admission medications   Medication Sig Start Date End Date Taking? Authorizing Provider  oxyCODONE-acetaminophen (PERCOCET/ROXICET) 5-325 MG per tablet Take 1-2 tablets by mouth every 4 (four) hours as needed for moderate pain. 05/16/15   Barnetta Chapel, PA-C   Triage Vitals: BP 121/73 mmHg  Pulse 73  Temp(Src) 98 F (36.7 C) (Oral)  Resp 17  Ht  (1.575 m)  Wt 128 lb 8 oz (58.287 kg)  BMI 23.50 kg/m2  SpO2 99%   Physical Exam  Constitutional: He is oriented to person, place, and time. He appears well-developed and well-nourished.  HENT:  Head: Normocephalic.  Eyes: EOM are normal.  Neck: Normal range of motion.  Cardiovascular: Normal rate, regular rhythm and normal heart sounds.   Pulmonary/Chest: Effort normal and breath sounds normal.  Lungs are clear  Abdominal: He exhibits no distension. There is tenderness.  Positive bowel sounds RUQ and RLQ tenderness Positive McBurney's sign   Musculoskeletal: Normal range of motion.  Neurological: He is alert and oriented to person, place, and time.  Psychiatric: He has a normal mood and affect.  Nursing note and vitals reviewed.   ED Course  Procedures (including critical care time)  DIAGNOSTIC STUDIES: Oxygen Saturation is 99% on RA, Normal by my interpretation.    COORDINATION OF CARE: 1:47 AM- Will order CBC, I-stat chem 8, and urinalysis. Discussed treatment plan with pt at  bedside and pt agreed to plan.     2:38 AM- Pt has acute appendicitis . Will call general surgery.  2:46 AM- Spoke with Dr. Magnus IvanBlackman. He will come down to evaluate the pt.  Labs Review Labs Reviewed  SURGICAL PCR SCREEN - Abnormal; Notable for the following:    Staphylococcus aureus POSITIVE (*)    All other components within normal limits  CBC WITH DIFFERENTIAL/PLATELET - Abnormal; Notable for the following:     WBC 12.2 (*)    Neutrophils Relative % 80 (*)    Neutro Abs 9.8 (*)    All other components within normal limits  URINALYSIS, ROUTINE W REFLEX MICROSCOPIC (NOT AT [facility]) - Abnormal; Notable for the following:    APPearance CLOUDY (*)    All other components within normal limits  I-STAT CHEM 8, ED - Abnormal; Notable for the following:    Potassium 3.4 (*)    Chloride 99 (*)    All other components within normal limits  SURGICAL PATHOLOGY    Imaging Review No results found.   EKG Interpretation None      MDM   Final diagnoses:  Acute appendicitis with localized peritonitis   I personally performed the services described in this documentation, which was scribed in my presence. The recorded information has been reviewed and is accurate.  Pt comes with abd pain. Has RLQ tenderness, with mcburneys- CT confirms appy. Surgery to admit and start antibiotics.  Derwood KaplanAnkit Lilian Fuhs, MD 05/18/15 [id number]

## 2015-05-16 NOTE — Discharge Summary (Addendum)
Patient ID: Siri ColeRigoberto Rodriguez-Reyes MRN: [id number] DOB/AGE: [birth year] 44 y.o.  Admit date: 05/16/2015 Discharge date: 05/16/2015  Procedures: lap appy  Consults: None  Reason for Admission: This gentleman presents with a one-day history of right lower quadrant abdominal pain which he describes as sharp and constant. He has had one episode of nausea and vomiting. He has no previous history of abdominal pain. He is otherwise healthy and without complaints. Bowel movements a been normal. He denies fever.  Admission Diagnoses:  1. Acute appendicitis  Hospital Course: The patient was admitted and taken to the OR where he underwent a lap appy.  He tolerated this well.  He did have some initial issues voiding, but was able to eventually void on his own prior to discharge.  He was taking an oral diet and oral pain meds well.  He was stable for dc home.  PE: Abd: soft, appropriately tender, +BS, ND, incisions c/d/i with no drainage noted  Discharge Diagnoses:  Active Problems:   Acute appendicitis s/p lap appy  Discharge Medications:   Medication List    Notice    You have not been prescribed any medications.      Discharge Instructions:     Follow-up Information    Follow up with [facility] On 06/03/2015.   Specialty:  General Surgery   Why:  [facility], 1:45pm, arrive no later than 1:15pm for paperwork   Contact information:   [address] [place] [address] [phone]       Signed: Letha CapeOSBORNE,KELLY E 05/16/2015, 10:25 AM  Agree with above. Daughter and wife in room.  Daughter acts as Nurse, learning disabilitytranslator. Should be ready to go home this afternoon.  Ovidio Kinavid Jamice Carreno, MD, [facility] Pager: [phone] Office phone:  [phone]

## 2015-05-16 NOTE — Anesthesia Procedure Notes (Signed)
Procedure Name: Intubation Date/Time: 05/16/2015 7:30 AM Performed by: Coralee RudFLORES, Juanna Pudlo Pre-anesthesia Checklist: Patient identified, Emergency Drugs available and Suction available Patient Re-evaluated:Patient Re-evaluated prior to inductionOxygen Delivery Method: Circle system utilized Preoxygenation: Pre-oxygenation with 100% oxygen Intubation Type: IV induction Ventilation: Mask ventilation without difficulty Laryngoscope Size: Miller and 3 Grade View: Grade I Tube type: Oral Tube size: 7.5 mm Number of attempts: 1 Airway Equipment and Method: Stylet Placement Confirmation: ETT inserted through vocal cords under direct vision and positive ETCO2 Secured at: 22 cm Tube secured with: Tape Dental Injury: Teeth and Oropharynx as per pre-operative assessment

## 2015-05-16 NOTE — H&P (Signed)
Dan Mann is an 44 y.o. male.   Chief Complaint: Right lower quadrant abdominal pain HPI: This gentleman presents with a one-day history of right lower quadrant abdominal pain which he describes as sharp and constant. He has had one episode of nausea and vomiting. He has no previous history of abdominal pain. He is otherwise healthy and without complaints. Bowel movements a been normal. He denies fever.  History reviewed. No pertinent past medical history.  No past surgical history on file.  Family History  Problem Relation Age of Onset  . Diabetes Mother   . Hypertension Mother   . Cancer Neg Hx   . Heart disease Neg Hx    Social History:  reports that he quit smoking about 20 years ago. His smoking use included Cigarettes. He has a .125 pack-year smoking history. He has never used smokeless tobacco. He reports that he drinks about 25.2 oz of alcohol per week. He reports that he does not use illicit drugs.  Allergies: No Known Allergies  No prescriptions prior to admission    Results for orders placed or performed during the hospital encounter of 05/16/15 (from the past 48 hour(s))  CBC with Differential     Status: Abnormal   Collection Time: 05/15/15  8:50 PM  Result Value Ref Range   WBC 12.2 (H) 4.0 - 10.5 K/uL   RBC 4.91 4.22 - 5.81 MIL/uL   Hemoglobin 14.1 13.0 - 17.0 g/dL   HCT 16.1 09.6 - 04.5 %   MCV 81.9 78.0 - 100.0 fL   MCH 28.7 26.0 - 34.0 pg   MCHC 35.1 30.0 - 36.0 g/dL   RDW 40.9 81.1 - 91.4 %   Platelets 190 150 - 400 K/uL   Neutrophils Relative % 80 (H) 43 - 77 %   Neutro Abs 9.8 (H) 1.7 - 7.7 K/uL   Lymphocytes Relative 12 12 - 46 %   Lymphs Abs 1.5 0.7 - 4.0 K/uL   Monocytes Relative 8 3 - 12 %   Monocytes Absolute 0.9 0.1 - 1.0 K/uL   Eosinophils Relative 0 0 - 5 %   Eosinophils Absolute 0.0 0.0 - 0.7 K/uL   Basophils Relative 0 0 - 1 %   Basophils Absolute 0.0 0.0 - 0.1 K/uL  I-Stat Chem 8, ED     Status: Abnormal   Collection Time:  05/15/15  9:00 PM  Result Value Ref Range   Sodium 139 135 - 145 mmol/L   Potassium 3.4 (L) 3.5 - 5.1 mmol/L   Chloride 99 (L) 101 - 111 mmol/L   BUN 18 6 - 20 mg/dL   Creatinine, Ser 7.82 0.61 - 1.24 mg/dL   Glucose, Bld 93 65 - 99 mg/dL   Calcium, Ion 9.56 2.13 - 1.23 mmol/L   TCO2 26 0 - 100 mmol/L   Hemoglobin 15.0 13.0 - 17.0 g/dL   HCT 08.6 57.8 - 46.9 %  Urinalysis, Routine w reflex microscopic (not at [facility])     Status: Abnormal   Collection Time: 05/16/15  2:30 AM  Result Value Ref Range   Color, Urine YELLOW YELLOW   APPearance CLOUDY (A) CLEAR   Specific Gravity, Urine 1.013 1.005 - 1.030   pH 6.0 5.0 - 8.0   Glucose, UA NEGATIVE NEGATIVE mg/dL   Hgb urine dipstick NEGATIVE NEGATIVE   Bilirubin Urine NEGATIVE NEGATIVE   Ketones, ur NEGATIVE NEGATIVE mg/dL   Protein, ur NEGATIVE NEGATIVE mg/dL   Urobilinogen, UA 0.2 0.0 - 1.0 mg/dL  Nitrite NEGATIVE NEGATIVE   Leukocytes, UA NEGATIVE NEGATIVE    Comment: MICROSCOPIC NOT DONE ON URINES WITH NEGATIVE PROTEIN, BLOOD, LEUKOCYTES, NITRITE, OR GLUCOSE <1000 mg/dL.   Ct Abdomen Pelvis W Contrast  05/16/2015   CLINICAL DATA:  Acute onset of right lower quadrant abdominal pain and vomiting. Initial encounter.  EXAM: CT ABDOMEN AND PELVIS WITH CONTRAST  TECHNIQUE: Multidetector CT imaging of the abdomen and pelvis was performed using the standard protocol following bolus administration of intravenous contrast.  CONTRAST:  OMNIPAQUE IOHEXOL 300 MG/ML  SOLN  COMPARISON:  None.  FINDINGS: Minimal left basilar atelectasis is noted.  The liver and spleen are unremarkable in appearance. The gallbladder is within normal limits. The pancreas and adrenal glands are unremarkable.  The kidneys are unremarkable in appearance. There is no evidence of hydronephrosis. No renal or ureteral stones are seen. No perinephric stranding is appreciated.  The small bowel is unremarkable in appearance. The stomach is within normal limits. No acute vascular  abnormalities are seen.  The appendix is dilated to 1.3 cm in diameter, with significant wall thickening and mild surrounding soft tissue inflammation, compatible with acute appendicitis. There is no evidence of perforation or abscess formation at this time. Visualized pericecal nodes remain normal in size. Trace free fluid is noted at the right lower quadrant.  Contrast progresses to the level of the distal transverse colon. The colon is unremarkable in appearance.  The bladder is moderately distended and grossly unremarkable in appearance. The prostate remains normal in size. No inguinal lymphadenopathy is seen.  No acute osseous abnormalities are identified. A chronic right-sided pars defect is seen at L5, without evidence of anterolisthesis.  IMPRESSION: 1. Acute appendicitis noted, with dilatation of the appendix to 1.3 cm in maximal diameter, significant wall thickening and mild surrounding soft tissue inflammation. No evidence of perforation or abscess formation at this time. Trace free fluid at the right lower quadrant. The appendix extends toward the midline, and has a somewhat thickened cecal base. 2. 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 These results were called by telephone at the time of interpretation on 05/16/2015 at 2:44 am to Dr. Derwood Kaplan , who verbally acknowledged these results.   Electronically Signed   By: Roanna Raider M.D.   On: 05/16/2015 02:44    Review of Systems  All other systems reviewed and are negative.   Blood pressure 118/70, pulse 74, temperature 97.8 F (36.6 C), temperature source Oral, resp. rate 18, height  (1.575 m), weight 58.287 kg (128 lb 8 oz), SpO2 100 %. Physical Exam  Constitutional: He is oriented to person, place, and time. He appears well-developed and well-nourished. No distress.  HENT:  Head: Normocephalic and atraumatic.  Right Ear: External ear normal.  Left Ear: External ear normal.  Nose: Nose  normal.  Mouth/Throat: Oropharynx is clear and moist. No oropharyngeal exudate.  Eyes: Conjunctivae are normal. Pupils are equal, round, and reactive to light. Right eye exhibits no discharge. Left eye exhibits no discharge. No scleral icterus.  Neck: Normal range of motion. Neck supple. No tracheal deviation present.  Cardiovascular: Normal rate, regular rhythm, normal heart sounds and intact distal pulses.   No murmur heard. Respiratory: Effort normal and breath sounds normal. No respiratory distress. He has no wheezes.  GI: Soft. Bowel sounds are normal. He exhibits no distension. There is tenderness. There is guarding.  Tenderness and guarding in the right lower quadrant  Musculoskeletal: Normal range of motion. He exhibits  no edema or tenderness.  Lymphadenopathy:    He has no cervical adenopathy.  Neurological: He is alert and oriented to person, place, and time.  Skin: Skin is warm and dry. No rash noted. He is not diaphoretic. No erythema.  Psychiatric: His behavior is normal. Judgment normal.     Assessment/Plan Acute appendicitis  I discussed the diagnosis to the patient through his family has an interpreter. Removal of the appendix is recommended. I discussed laparoscopic appendectomy with the patient in detail. I discussed the risks and details well. These include but are not limited to bleeding, infection, injury to surrounding structures, stump leak, need for further surgery, cardiopulmonary issues, DVT, postop recovery, etc. This will be performed by Dr. Axel FillerArmando Ramirez later today.   Sarafina Puthoff A 05/16/2015, 6:04 AM

## 2015-05-17 DIAGNOSIS — K353 Acute appendicitis with localized peritonitis: Secondary | ICD-10-CM | POA: Diagnosis present

## 2015-05-17 DIAGNOSIS — K3580 Unspecified acute appendicitis: Secondary | ICD-10-CM | POA: Diagnosis not present

## 2015-05-17 NOTE — Progress Notes (Deleted)
Dan Mann to be D/C'd Home per MD order.  Discussed with the patient and all questions fully answered.  VSS, Skin clean, dry and intact without evidence of skin break down, no evidence of skin tears noted. IV catheter discontinued intact. Site without signs and symptoms of complications. Dressing and pressure applied.  An After Visit Summary was printed and given to the patient. Patient received prescription.  D/c education completed with patient/family including follow up instructions, medication list, d/c activities limitations if indicated, with other d/c instructions as indicated by MD - patient able to verbalize understanding, all questions fully answered.   Patient instructed to return to ED, call 911, or call MD for any changes in condition.   Patient escorted via WC, and D/C home via private auto.  Vivianne MasterKristie G Ashiya Kinkead 05/17/2015 1345

## 2015-05-17 NOTE — Progress Notes (Signed)
Dan Mann to be D/C'd Home per MD order.  Discussed with the patient and all questions fully answered.  VSS, Skin clean, dry and intact without evidence of skin break down, no evidence of skin tears noted. IV catheter discontinued intact. Site without signs and symptoms of complications. Dressing and pressure applied.  An After Visit Summary was printed and given to the patient. Patient received prescription.  D/c education completed with patient/family including follow up instructions, medication list, d/c activities limitations if indicated, with other d/c instructions as indicated by MD - patient able to verbalize understanding, all questions fully answered.   Patient instructed to return to ED, call 911, or call MD for any changes in condition.   Patient escorted via WC, and D/C home via private auto.  [name] 05/17/2015 2:20

## 2015-05-17 NOTE — Discharge Instructions (Signed)
CIRUGIA LAPAROSCOPICA: INSTRUCCIONES DE POST OPERATORIO. ° °Revise siempre los documentos que le entreguen en el lugar donde se ha hecho la cirugia. ° °SI USTED NECESITA DOCUMENTOS DE INCAPACIDAD (DISABLE) O DE PERMISO FAMILAR (FAMILY LEAVE) NECESITA TRAERLOS A LA OFICINA PARA QUE SEAN PROCESADOS. °NO  SE LOS DE A SU DOCTOR. °1. A su alta del hospital se le dara una receta para controlar el dolor. Tomela como ha sido recetada, si la necesita. Si no la necesita puede tomar, Acetaminofen (Tylenol) o Ibuprofen (Advil) para aliviar dolor moderado. °2. Continue tomando el resto de sus medicinas. °3. Si necesita rellenar la receta, llame a la farmacia. ellos contactan a nuestra oficina pidiendo autorizacion. Este tipo de receta no pueden ser rellenadas despues de las  5pm o durante los fines de semana. °4. Con relacion a la dieta: debe ser ligera los primeros dias despues que llege a la casa. Ejemplo: sopas y galleticas. Tome bastante liquido esos dias. °5. La mayoria de los pacientes padecen de inflamacion y cambio de coloracion de la piel alrededor de las incisiones. esto toma dias en resolver.  pnerse una bolsa de hielo en el area affectada ayuda..  °6. Es comun tambien tener un poco de estrenimiento si esta tomado medicinas para el dolor. incremente la cantidad de liquidos a tomar y puede tomar (Colace) esto previene el problema. Si ya tiene estrenimiento, es decir no ha defecado en 48 horas, puede tomar un laxativo (Milk of Magnesia or Miralax) uselo como el paquete le explica. °7.  A menos que se le diga algo diferente. Remueva el bendaje a las 24-48 horas despues dela cirugia. y puede banarse en la ducha sin ningun problema. usted puede tener steri-strips (pequenas curitas transparentes en la piel puesta encima de la incision)  Estas banditas strips should be left on the skin for 7-10 days.   Si su cirujano puso pegamento encima de la incision usted puede banarse bajo la ducha en 24 horas. Este pegamento empezara a  caerse en las proximas 2-3 semanas. Si le pusieron suturas o presillas (grapos) estos seran quitados en su proxima cita en la oficina. . °a. ACTIVIDADES:  Puede hacer actividad ligera.  Como caminar , subir escaleras y poco a poco irlas incrementando tanto como las tolere. Puede tener relaciones sexuales cuando sea comfortable. No carge objetos pesados o haga esfuerzos que no sean aprovados por su doctor. °b. Puede manejar en cuanto no esta tomando medicamentos fuertes (narcoticos) para el dolor, pueda abrochar confortablemente el cinturon de seguridad, y pueda maniobrar y usar los pedales de su vehiculo con seguridad. °c. PUEDE REGRESAR A TRABAJAR  °8. Debe ver a su doctor para una cita de seguimiento en 2-3 semanas despues de la cirugia.  °9. OTRAS ISNSTRUCCIONES:___________________________________________________________________________________ °CUANDO LLAMAR A SU MEDICO: °1. FIEBRE mayor de  101.0 °2. No produccion de orina. °3. Sangramiento continue de la herida °4. Incremento de dolor, enrojecimientio o drenaje de la herida (incision) °5. Incremento de dolor abdominal. ° °The clinic staff is available to answer your questions during regular business hours.  Please don’t hesitate to call and ask to speak to one of the nurses for clinical concerns.  If you have a medical emergency, go to the nearest emergency room or call 911.  A surgeon from [facility] is always on call at the hospital. °[address], [address], [place], [place]  [address] ? [address] [address], [place],    [address] °[phone] ? [phone] ? FAX [phone] °Web site: [email or web address] ° ° °

## 2015-05-19 ENCOUNTER — Encounter (HOSPITAL_COMMUNITY): Payer: Self-pay | Admitting: General Surgery

## 2015-06-04 ENCOUNTER — Ambulatory Visit: Payer: Self-pay

## 2015-06-10 ENCOUNTER — Ambulatory Visit: Payer: Self-pay | Attending: Family Medicine

## 2015-12-30 ENCOUNTER — Ambulatory Visit: Payer: Medicaid Other | Attending: Family Medicine

## 2016-02-19 IMAGING — CT CT ABD-PELV W/ CM
2 of 5 series · 4 of 46 positions shown, 6 images · IV contrast (Iodine)
Comparison: None.

CLINICAL DATA: Acute onset of right lower quadrant abdominal pain
and vomiting. Initial encounter.

EXAM:
CT ABDOMEN AND PELVIS WITH CONTRAST
TECHNIQUE: Multidetector CT imaging of the abdomen and pelvis was performed
using the standard protocol following bolus administration of
intravenous contrast.
CONTRAST:  100mL OMNIPAQUE IOHEXOL 300 MG/ML  SOLN

[Series 204: coronal · coronal · 0.45mm/px · 3 of 80 slices shown, 4 images]
[im 18/80  soft-tissue]
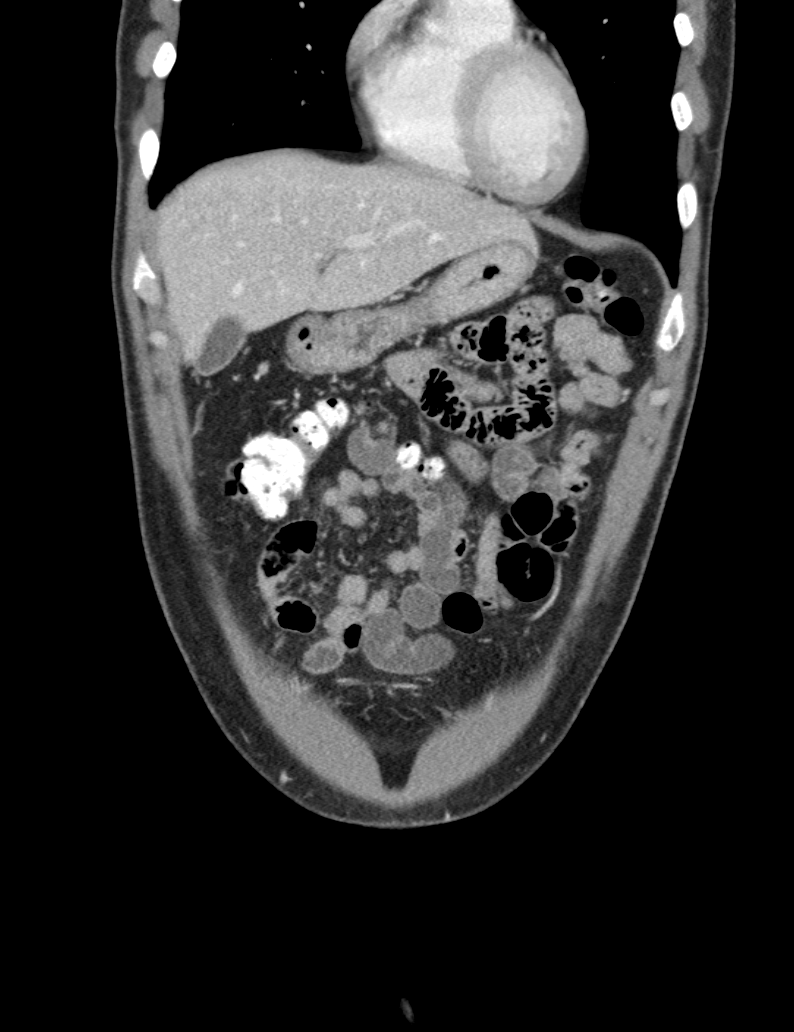
[im 18/80  bone]
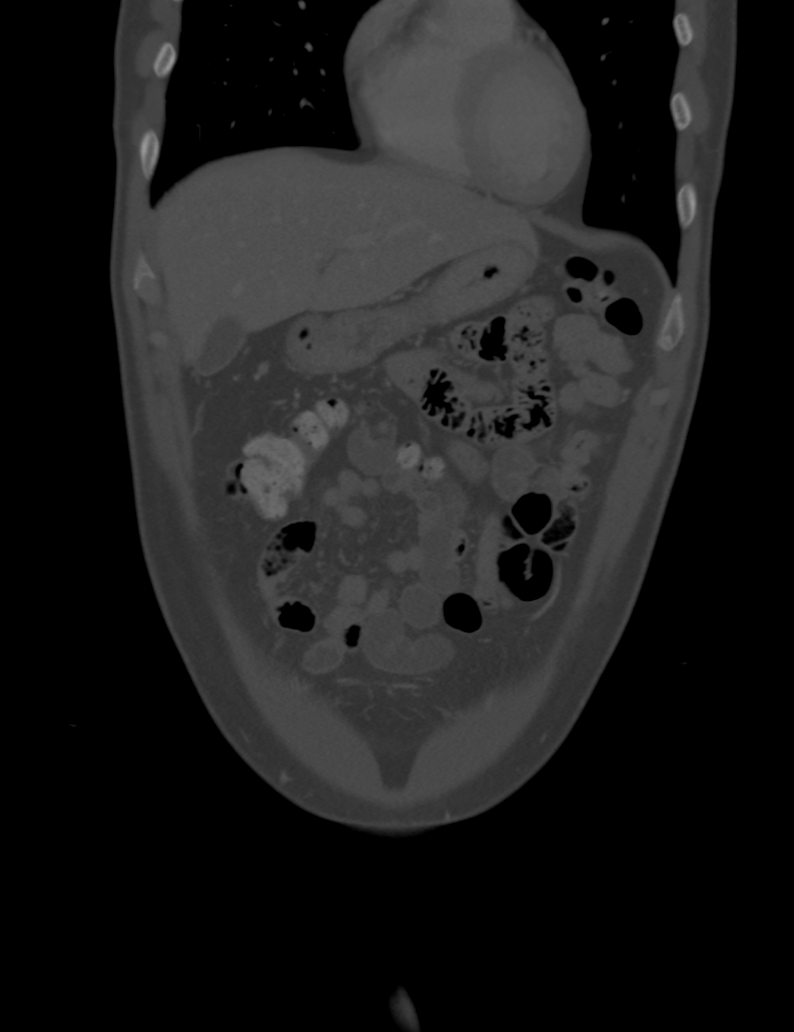
[im 44/80  soft-tissue]
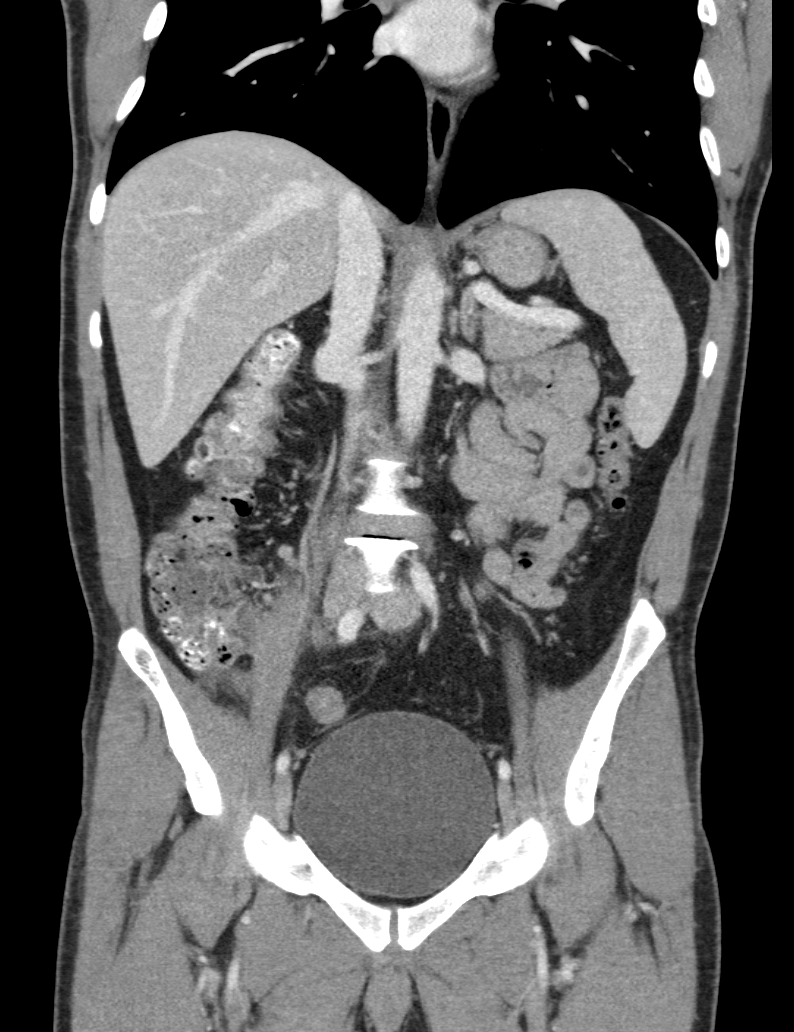
[im 62/80  soft-tissue]
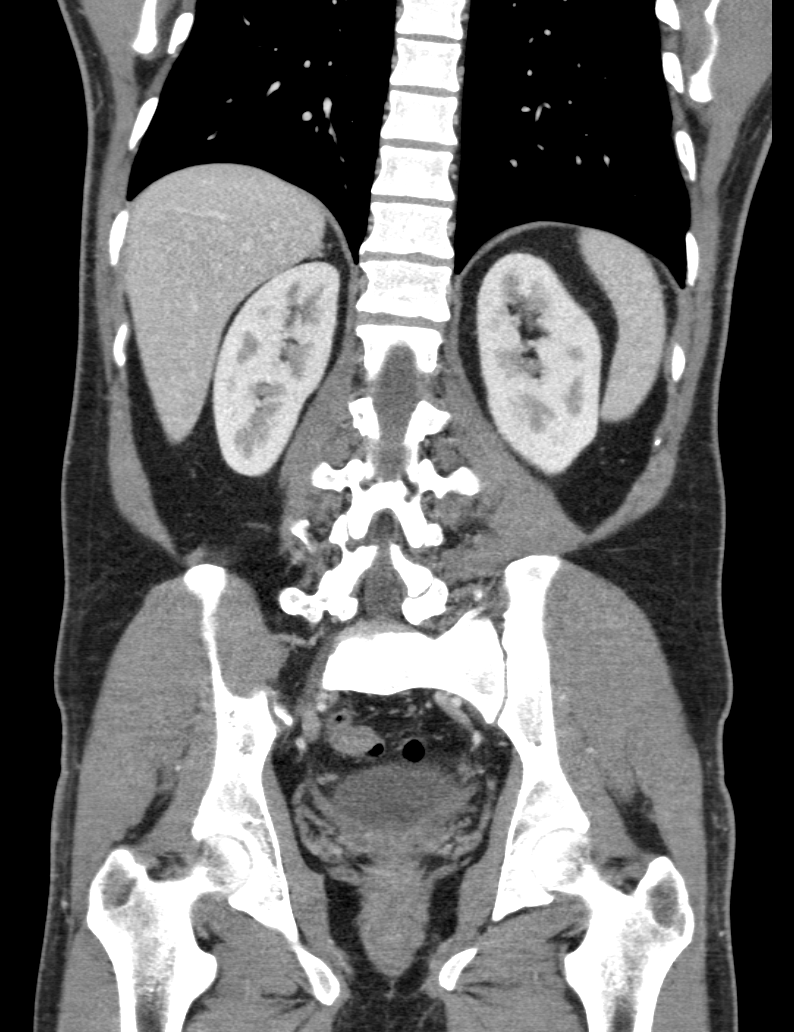

[Series 205: sagittal · sagittal · 0.45mm/px · 1 of 115 slices shown, 2 images]
[im 39/115  soft-tissue]
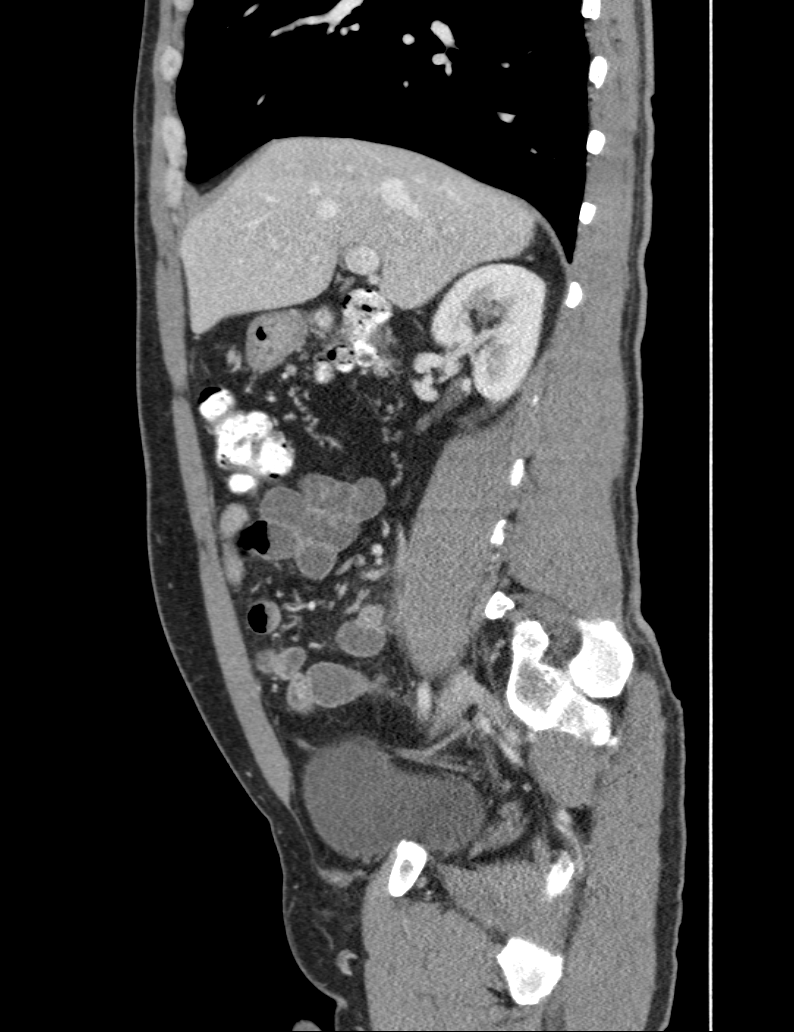
[im 39/115  bone]
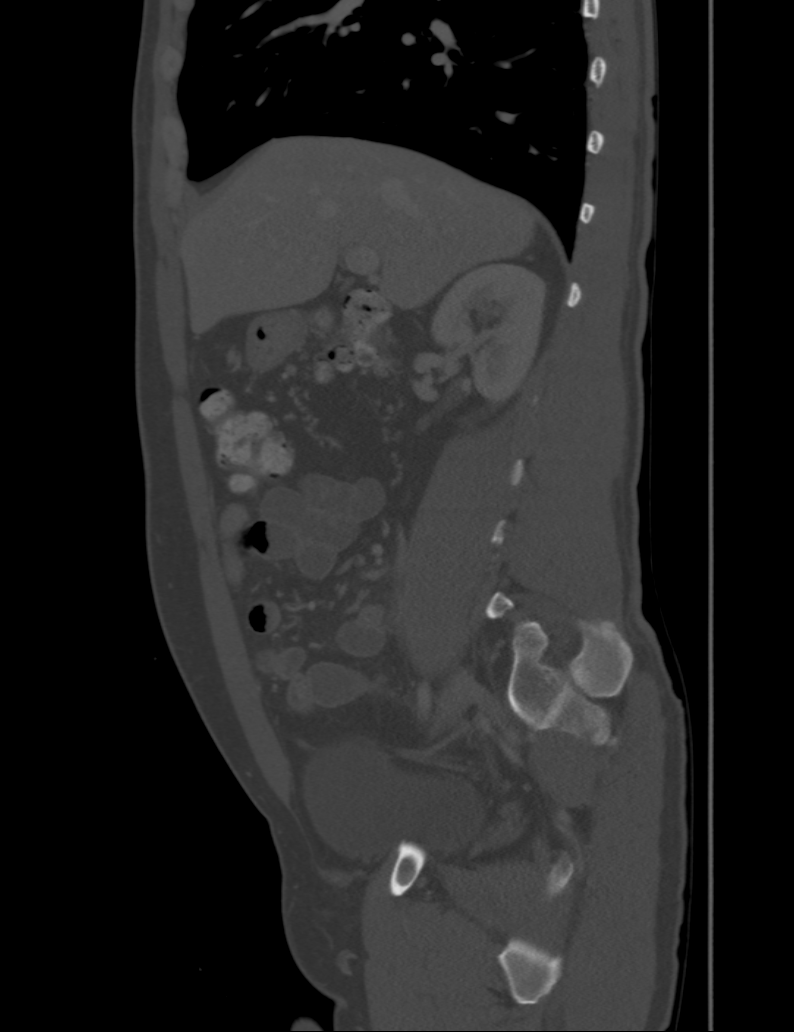

[4 of 46 positions shown; findings below may reference images not displayed]

FINDINGS: Minimal left basilar atelectasis is noted.

The liver and spleen are unremarkable in appearance. The gallbladder
is within normal limits. The pancreas and adrenal glands are
unremarkable.

The kidneys are unremarkable in appearance. There is no evidence of
hydronephrosis. No renal or ureteral stones are seen. No perinephric
stranding is appreciated.

The small bowel is unremarkable in appearance. The stomach is within
normal limits. No acute vascular abnormalities are seen.

The appendix is dilated to 1.3 cm in diameter, with significant wall
thickening and mild surrounding soft tissue inflammation, compatible
with acute appendicitis. There is no evidence of perforation or
abscess formation at this time. Visualized pericecal nodes remain
normal in size. Trace free fluid is noted at the right lower
quadrant.

Contrast progresses to the level of the distal transverse colon. The
colon is unremarkable in appearance.

The bladder is moderately distended and grossly unremarkable in
appearance. The prostate remains normal in size. No inguinal
lymphadenopathy is seen.

No acute osseous abnormalities are identified. A chronic right-sided
pars defect is seen at L5, without evidence of anterolisthesis.
IMPRESSION: 1. Acute appendicitis noted, with dilatation of the appendix to
cm in maximal diameter, significant wall thickening and mild
surrounding soft tissue inflammation. No evidence of perforation or
abscess formation at this time. Trace free fluid at the right lower
quadrant. The appendix extends toward the midline, and has a
somewhat thickened cecal base.
2. Chronic right-sided pars defect at L5, without evidence of
anterolisthesis.
These results were called by telephone at the time of interpretation
on 05/16/2015 at [DATE] to Dr. WILPHANO BEAUBRUN , who verbally
acknowledged these results.

## 2017-01-07 ENCOUNTER — Ambulatory Visit: Payer: Medicaid Other | Attending: Family Medicine

## 2017-01-24 ENCOUNTER — Ambulatory Visit: Payer: Medicaid Other | Attending: Family Medicine | Admitting: Family Medicine

## 2017-01-24 ENCOUNTER — Encounter: Payer: Self-pay | Admitting: Family Medicine

## 2017-01-24 VITALS — BP 124/74 | HR 63 | Temp 98.0°F | Resp 18 | Ht 64.0 in | Wt 133.4 lb

## 2017-01-24 DIAGNOSIS — K029 Dental caries, unspecified: Secondary | ICD-10-CM

## 2017-01-24 DIAGNOSIS — Z23 Encounter for immunization: Secondary | ICD-10-CM | POA: Diagnosis not present

## 2017-01-24 DIAGNOSIS — Z0000 Encounter for general adult medical examination without abnormal findings: Secondary | ICD-10-CM | POA: Diagnosis not present

## 2017-01-24 DIAGNOSIS — H547 Unspecified visual loss: Secondary | ICD-10-CM | POA: Diagnosis not present

## 2017-01-24 DIAGNOSIS — Z139 Encounter for screening, unspecified: Secondary | ICD-10-CM | POA: Insufficient documentation

## 2017-01-24 DIAGNOSIS — Z87891 Personal history of nicotine dependence: Secondary | ICD-10-CM | POA: Insufficient documentation

## 2017-01-24 LAB — COMPLETE METABOLIC PANEL WITH GFR
ALT: 17 U/L (ref 9–46)
AST: 17 U/L (ref 10–40)
Albumin: 4.6 g/dL (ref 3.6–5.1)
Alkaline Phosphatase: 78 U/L (ref 40–115)
BUN: 14 mg/dL (ref 7–25)
CHLORIDE: 105 mmol/L (ref 98–110)
CO2: 22 mmol/L (ref 20–31)
Calcium: 9.6 mg/dL (ref 8.6–10.3)
Creat: 0.87 mg/dL (ref 0.60–1.35)
GFR, Est African American: >89 mL/min (ref >=60)
Glucose, Bld: 100 mg/dL — ABNORMAL HIGH (ref 65–99)
POTASSIUM: 4.3 mmol/L (ref 3.5–5.3)
Sodium: 139 mmol/L (ref 135–146)
Total Bilirubin: 0.4 mg/dL (ref 0.2–1.2)
Total Protein: 7.6 g/dL (ref 6.1–8.1)

## 2017-01-24 LAB — LIPID PANEL
Cholesterol: 257 mg/dL — ABNORMAL HIGH (ref <200)
HDL: 45 mg/dL (ref >40)
LDL CALC: 187 mg/dL — AB (ref <100)
Total CHOL/HDL Ratio: 5.7 Ratio — ABNORMAL HIGH (ref <5.0)
Triglycerides: 126 mg/dL (ref <150)
VLDL: 25 mg/dL (ref <30)

## 2017-01-24 LAB — CBC
HCT: 43.3 % (ref 38.5–50.0)
Hemoglobin: 15.2 g/dL (ref 13.2–17.1)
MCH: 28.7 pg (ref 27.0–33.0)
MCHC: 35.1 g/dL (ref 32.0–36.0)
MCV: 81.9 fL (ref 80.0–100.0)
MPV: 10.9 fL (ref 7.5–12.5)
Platelets: 213 10*3/uL (ref 140–400)
RBC: 5.29 MIL/uL (ref 4.20–5.80)
RDW: 13.7 % (ref 11.0–15.0)
WBC: 5.8 10*3/uL (ref 3.8–10.8)

## 2017-01-24 NOTE — Progress Notes (Signed)
Patient is here for Physical  Patient denies pain at this time.  Patient has not taken medication today. Patient has not eaten today.  Patient tolerated flu and tdap injection well today.

## 2017-01-24 NOTE — Assessment & Plan Note (Signed)
Vital signs normal Health maintenance addressed Labs per orders Dental and vision referral for maintenance

## 2017-01-24 NOTE — Patient Instructions (Addendum)
Dan Mann was seen today for annual exam.  Diagnoses and all orders for this visit:  Visit for well man health check -     POCT urinalysis dipstick -     CBC -     COMPLETE METABOLIC PANEL WITH GFR -     Lipid Panel  Decreased visual acuity -     Ambulatory referral to Optometry  Dental cavities -     Ambulatory referral to Dentistry   f/u in one year for wellness physical  Dr. Armen PickupFunches

## 2017-01-24 NOTE — Progress Notes (Signed)
SUBJECTIVE:  Dan Mann is a 46 y.o. male presenting for his annual checkup. He complains of R upper molar pain last week. No pain now.   Past Surgical History:  Procedure Laterality Date  . LAPAROSCOPIC APPENDECTOMY N/A 05/16/2015   Procedure: APPENDECTOMY LAPAROSCOPIC;  Surgeon: Axel FillerArmando Ramirez, MD;  Location: [facility] OR;  Service: General;  Laterality: N/A;   Social History  Substance Use Topics  . Smoking status: Former Smoker    Packs/day: 0.25    Years: 0.50    Types: Cigarettes    Quit date: 07/10/1994  . Smokeless tobacco: Never Used  . Alcohol use 25.2 oz/week    42 Cans of beer per week     Comment: 4-6 beers a day    Current Outpatient Prescriptions  Medication Sig Dispense Refill  . oxyCODONE-acetaminophen (PERCOCET/ROXICET) 5-325 MG per tablet Take 1-2 tablets by mouth every 4 (four) hours as needed for moderate pain. 40 tablet 0   No current facility-administered medications for this visit.    Allergies: Patient has no known allergies.   ROS:  Feeling well. No dyspnea or chest pain on exertion. No abdominal pain, change in bowel habits, black or bloody stools. No urinary tract or prostatic symptoms. No neurological complaints. He reports low back pain that comes and goes. R upper molar pain last week. Fatigue in eyes.   OBJECTIVE:  The patient appears well, alert, oriented x 3, in no distress.  BP 124/74 (BP Location: Right Arm, Patient Position: Sitting, Cuff Size: Normal)   Pulse 63   Temp 98 F (36.7 C) (Oral)   Resp 18   Ht 5\' 4"  (1.626 m)   Wt 133 lb 6.4 oz (60.5 kg)   SpO2 99%   BMI 22.90 kg/m  Poor dentition, no abscess, PERRLA. Oropharynx normal. Normal nasal turbinate. Normal external auditory canals and TM.  Neck supple. No adenopathy or thyromegaly. PERLA. Lungs are clear, good air entry, no wheezes, rhonchi or rales. S1 and S2 normal, no murmurs, regular rate and rhythm. Abdomen is soft without tenderness, guarding, mass or organomegaly. GU  exam: no penile lesions or discharge, no testicular masses or tenderness, no hernias.  Extremities show no edema, normal peripheral pulses. Neurological is normal without focal findings.  ASSESSMENT:  healthy adult male  PLAN:  CBC, CMP, lipids Decrease ETOH Increase exercise

## 2017-01-25 ENCOUNTER — Telehealth: Payer: Self-pay

## 2017-01-25 NOTE — Telephone Encounter (Signed)
-----   Message from Dessa PhiJosalyn Funches, MD sent at 01/25/2017  8:20 AM EDT ----- Normal CMP and CBC Elevated LDL and total cholesterol Improve cholesterol for increasing exercise, reduce sugar in diet, reduced saturated fats (fried foods) Increase omega-3 fats (avocado, olive oil, fish)

## 2017-01-25 NOTE — Telephone Encounter (Signed)
CMA call patient to inform lab results  CMA spoke with wife Dan Mann maria   She was aware and understood

## 2017-03-23 ENCOUNTER — Encounter: Payer: Self-pay | Admitting: Family Medicine

## 2017-06-30 ENCOUNTER — Other Ambulatory Visit: Payer: Self-pay | Admitting: Internal Medicine

## 2017-06-30 ENCOUNTER — Ambulatory Visit
Admission: RE | Admit: 2017-06-30 | Discharge: 2017-06-30 | Disposition: A | Discharge status: Home / Self Care | Payer: Self-pay | Source: Ambulatory Visit | Attending: Internal Medicine | Admitting: Internal Medicine

## 2017-06-30 DIAGNOSIS — R7611 Nonspecific reaction to tuberculin skin test without active tuberculosis: Secondary | ICD-10-CM

## 2017-07-29 ENCOUNTER — Ambulatory Visit: Payer: Self-pay | Attending: Internal Medicine

## 2017-09-16 ENCOUNTER — Emergency Department (HOSPITAL_COMMUNITY): Payer: Self-pay

## 2017-09-16 ENCOUNTER — Other Ambulatory Visit: Payer: Self-pay

## 2017-09-16 ENCOUNTER — Emergency Department (HOSPITAL_COMMUNITY)
Admission: EM | Admit: 2017-09-16 | Discharge: 2017-09-16 | Disposition: A | Discharge status: Home / Self Care | Payer: Self-pay | Attending: Emergency Medicine | Admitting: Emergency Medicine

## 2017-09-16 ENCOUNTER — Encounter (HOSPITAL_COMMUNITY): Payer: Self-pay

## 2017-09-16 DIAGNOSIS — M25562 Pain in left knee: Secondary | ICD-10-CM | POA: Insufficient documentation

## 2017-09-16 DIAGNOSIS — Z87891 Personal history of nicotine dependence: Secondary | ICD-10-CM | POA: Insufficient documentation

## 2017-09-16 DIAGNOSIS — M25561 Pain in right knee: Secondary | ICD-10-CM

## 2017-09-16 MED ORDER — IBUPROFEN 400 MG PO TABS
600.0000 mg | ORAL_TABLET | Freq: Once | ORAL | Status: AC
  Administered 2017-09-16: 600 mg via ORAL
  Filled 2017-09-16: qty 1

## 2017-09-16 MED ORDER — IBUPROFEN 600 MG PO TABS: 600.0000 mg | ORAL_TABLET | Freq: Four times a day (QID) | ORAL | 0 refills | Status: DC | PRN

## 2017-09-16 MED ORDER — DICLOFENAC SODIUM 1 % TD GEL: 2.0000 g | Freq: Four times a day (QID) | TRANSDERMAL | 0 refills | Status: DC

## 2017-09-16 NOTE — Discharge Instructions (Signed)
Fue un placer cuidar de ti hoy! Por favor, consulte la informacin y las instrucciones a continuacin con respecto a su visita. Sus diagnsticos de hoy incluyen: 1. Dolor agudo de la rodilla derecha. Las pruebas realizadas hoy incluyen: Consulte el panel lateral de sus documentos de alta para las pruebas realizadas hoy. Los signos vitales se enumeran en la parte inferior de estas instrucciones. Radiografa de la rodilla no mostr huesos rotos. Medicamentos prescritos: Audiological scientistTome cualquier medicamento recetado solo como se lo recet, y Probation officercualquier medicamento de Sales promotion account executiveventa libre solo como se indica en el envase. Ibuprofeno segn sea necesario para el dolor. Use muletas segn sea necesario para su comodidad. Hielo y elevar la rodilla durante todo Medical laboratory scientific officerel da. Puede alternar la aplicacin de gel helado y diclofenaco en la parte posterior de la pierna. Instrucciones de cuidado en el hogar: Por favor siga cualquier material educativo contenido en este paquete. Instrucciones de seguimiento: Haga un seguimiento con su proveedor de atencin primaria para Warden/rangerestablecer la atencin para una evaluacin adicional de sus sntomas si no se mejoran por completo.  Llame al ortopedista que figura hoy o maana para programar una cita de seguimiento para volver a Cytogeneticistverificar el dolor de rodilla en una o Marsh & McLennandos semanas. Esa cita puede cancelarse con un aviso de 24-48 horas si se completa la resolucin del dolor. Haga un seguimiento con el ortopedista de la lista si los sntomas no mejoran en [place] semana. Instrucciones de devolucin: ? Regrese al MicrosoftDepartamento de Emergencias si experimenta empeoramiento de los sntomas. ? Regrese por cualquier aumento de hinchazn, aumento del dolor, prdida de color en la parte inferior de la pierna o aumento del enrojecimiento. ? Por favor regrese si tiene alguna otra inquietud emergente. Informacin Adicional: Tus signos vitales de hoy fueron: BP (!) 150/98 (BP Ubicacin: Brazo derecho)   Pulso 90   Temperatura 98  F (36.7   C) (Oral)   Resp 16   Ht 5 '6 "(1.676 m)   Peso 63.5 kg (140 lb)   SpO2 99%   IMC 22.60 kg / m Si su presin arterial (PA) se elev en varias lecturas durante esta visita por encima de 130 para el nmero superior o por encima de 80 para el nmero inferior, haga que su proveedor de atencin primaria lo repita dentro de un mes.    It was my pleasure taking care of you today!   Please see the information and instructions below regarding your visit.  Your diagnoses today include:  1. Acute pain of right knee     Tests performed today include: See side panel of your discharge paperwork for testing performed today. Vital signs are listed at the bottom of these instructions.   Medications prescribed:    Take any prescribed medications only as prescribed, and any over the counter medications only as directed on the packaging.  Ibuprofen as needed for pain. Use crutches as needed for comfort. Ice and elevate knee throughout the day.  You may apply icy hot or the diclofenac get to the back of your leg  Home care instructions:  Please follow any educational materials contained in this packet.   Please use warm compresses on the back of your leg and ice on the area that was injured.   Follow-up instructions: Please follow-up with your primary care provider as soon as possible for further evaluation of your symptoms if they are not completely improved.   Call the orthopedist listed today or tomorrow to schedule follow up appointment for recheck of ongoing  knee pain in one to two weeks. That appointment can be canceled with a 24-48 hour notice if complete resolution of pain.   Follow up with the orthopedist listed if symptoms do not improve in one week.   Return instructions:  Please return to the Emergency Department if you experience worsening symptoms.  Please return for any increasing swelling, increasing pain, loss of color to the lower leg, or increasing redness. Please return if you  have any other emergent concerns.  Additional Information:   Your vital signs today were: BP (!) 150/98 (BP Location: Right Arm)    Pulse 90    Temp 98 F (36.7 C) (Oral)    Resp 16    Ht 5\' 6"  (1.676 m)    Wt 63.5 kg (140 lb)    SpO2 99%    BMI 22.60 kg/m  If your blood pressure (BP) was elevated on multiple readings during this visit above 130 for the top number or above 80 for the bottom number, please have this repeated by your primary care provider within one month. --------------

## 2017-09-16 NOTE — ED Triage Notes (Signed)
Per the intepreter IPAD, pt from home after having a large piece of heavy metal land on his knee at 1000 this morning. Pt unable to bend at knee. CMS intact. VSS.

## 2017-09-17 NOTE — ED Provider Notes (Signed)
[facility]   CSN: [id number] Arrival date & time: 09/16/17  2022     History   Chief Complaint Chief Complaint  Patient presents with  . Knee Pain    HPI Dan Mann is a 46 y.o. male.  HPI   Patient is a 46 y.o. male with no significant past medical history presenting for left knee pain after a steel beam in his attic fell and hit his lateral left knee on the morning of 09/16/2017. No other injury sustained in the incident. Patient reports his posterior calf begins to cramp when he attempts to straighten out the left knee. Pain is 10/10 with movement. Patient can bear weight on the left lower extremity but has difficulty with ambulation. No remedies tried at home for symptoms. No distal extremity swelling, numbness, paresthesias, or weakness.   History reviewed. No pertinent past medical history.  Patient Active Problem List   Diagnosis Date Noted  . Visit for well man health check 01/24/2017  . Family history of diabetes mellitus (DM) 07/10/2014  . Heavy drinker 07/10/2014  . Dental cavities 07/10/2014    History reviewed. No pertinent surgical history.     Home Medications    Prior to Admission medications   Medication Sig Start Date End Date Taking? Authorizing Provider  diclofenac sodium (VOLTAREN) 1 % GEL Apply 2 g 4 (four) times daily topically. Apply to the back of your leg where you have pain. 09/16/17   Aviva KluverMurray, Alyssa B, PA-C  ibuprofen (ADVIL,MOTRIN) 600 MG tablet Take 1 tablet (600 mg total) every 6 (six) hours as needed by mouth. 09/16/17   Elisha PonderMurray, Alyssa B, PA-C    Family History Family History  Problem Relation Age of Onset  . Diabetes Mother   . Hypertension Mother   . Cancer Neg Hx   . Heart disease Neg Hx     Social History Social History   Tobacco Use  . Smoking status: Former Smoker    Packs/day: 0.25    Years: 0.50    Pack years: 0.12    Types: Cigarettes    Last  attempt to quit: 07/10/1994    Years since quitting: 23.2  . Smokeless tobacco: Never Used  Substance Use Topics  . Alcohol use: Yes    Alcohol/week: 25.2 oz    Types: 42 Cans of beer per week    Comment: 4-6 beers a day   . Drug use: No     Allergies   Patient has no known allergies.   Review of Systems Review of Systems  Cardiovascular: Negative for leg swelling.  Musculoskeletal: Positive for arthralgias.  Skin: Negative for color change and wound.  Neurological: Negative for weakness and numbness.     Physical Exam Updated Vital Signs BP (!) 134/99 (BP Location: Right Arm)   Pulse 75   Temp 98 F (36.7 C) (Oral)   Resp 16   Ht 5\' 6"  (1.676 m)   Wt 63.5 kg (140 lb)   SpO2 99%   BMI 22.60 kg/m   Physical Exam  Constitutional: He appears well-developed and well-nourished. No distress.  Sitting comfortably in bed.  HENT:  Head: Normocephalic and atraumatic.  Eyes: Conjunctivae are normal. Right eye exhibits no discharge. Left eye exhibits no discharge.  EOMs normal to gross examination.  Neck: Normal range of motion.  Cardiovascular: Normal rate and regular rhythm.  Intact, 2+ radial pulse.  Pulmonary/Chest: Effort normal and breath sounds normal.  Normal respiratory effort. Patient  converses comfortably. No audible wheeze or stridor.  Abdominal: He exhibits no distension.  Musculoskeletal:  No joint line tenderness of left knee. Tenderness to palpation of medial joint. Tenderness to palpation of hamstring insertion site. No abnormal alignment or patellar mobility. No bruising, erythema or warmth overlaying the joint. No varus/valgus laxity. Negative Lachman's.  No crepitus.  2+ DP pulses bilaterally. All compartments are soft. Sensation intact distal to injury.  Neurological: He is alert.  Cranial nerves intact to gross observation. Patient moves extremities without difficulty.  Skin: Skin is warm and dry. He is not diaphoretic.  Psychiatric: He has a normal  mood and affect. His behavior is normal. Judgment and thought content normal.  Nursing note and vitals reviewed.    ED Treatments / Results  Labs (all labs ordered are listed, but only abnormal results are displayed) Labs Reviewed - No data to display  EKG  EKG Interpretation None       Radiology Dg Knee Complete 4 Views Left  Result Date: 09/16/2017 CLINICAL DATA:  Knee pain EXAM: LEFT KNEE - COMPLETE 4+ VIEW COMPARISON:  None. FINDINGS: No fracture or dislocation is visible. No large joint effusion. No radiopaque foreign body in the soft tissues. IMPRESSION: No acute osseous abnormality Electronically Signed   By: Jasmine PangKim  Fujinaga M.D.   On: 09/16/2017 21:04    Procedures Procedures (including critical care time)  Medications Ordered in ED Medications  ibuprofen (ADVIL,MOTRIN) tablet 600 mg (600 mg Oral Given 09/16/17 2320)     Initial Impression / Assessment and Plan / ED Course  I have reviewed the triage vital signs and the nursing notes.  Pertinent labs & imaging results that were available during my care of the patient were reviewed by me and considered in my medical decision making (see chart for details).      Final Clinical Impressions(s) / ED Diagnoses   Final diagnoses:  Acute pain of right knee   Patient with likely muscle cramping of hamstring secondary to acute contusion of left knee. Xray exhibits no bony abnormality or large effusion. LLE is neurovascularly intact. I encouraged weight bearing as tolerated and warm compresses to the hamstring. I encouraged ice to the area of contusion. I encouraged patient use Icy Hot gel alternating with diclofenac gel. Patient without contraindications to NSAIDs. Short course of oral ibuprofen as needed for pain. Knee sleeve applied today. Return precautions for any increasing pain, paresthesias, swelling, or erythema. I counseled on the typical course of distal LE swelling after a contusion. Patient is in understanding and  agrees with the plan of care.   Instructions given with translation assistance of patient's brother. [employer] interpreter offered but patient deferred in preference of his brother.  ED Discharge Orders        Ordered    ibuprofen (ADVIL,MOTRIN) 600 MG tablet  Every 6 hours PRN     09/16/17 2317    diclofenac sodium (VOLTAREN) 1 % GEL  4 times daily     09/16/17 2317       Elisha PonderMurray, Alyssa B, [place]PA-C 09/17/17 0428    Lorre NickAllen, Anthony, MD 09/19/17 [phone]

## 2017-10-04 ENCOUNTER — Inpatient Hospital Stay: Payer: Self-pay

## 2018-02-27 ENCOUNTER — Ambulatory Visit: Payer: Self-pay

## 2018-04-05 IMAGING — CR DG CHEST 1V
1 series · 1 of 1 positions shown · non-contrast
Comparison: No recent prior.

CLINICAL DATA: Positive PPD.

EXAM:
CHEST 1 VIEW

[w chest pa]
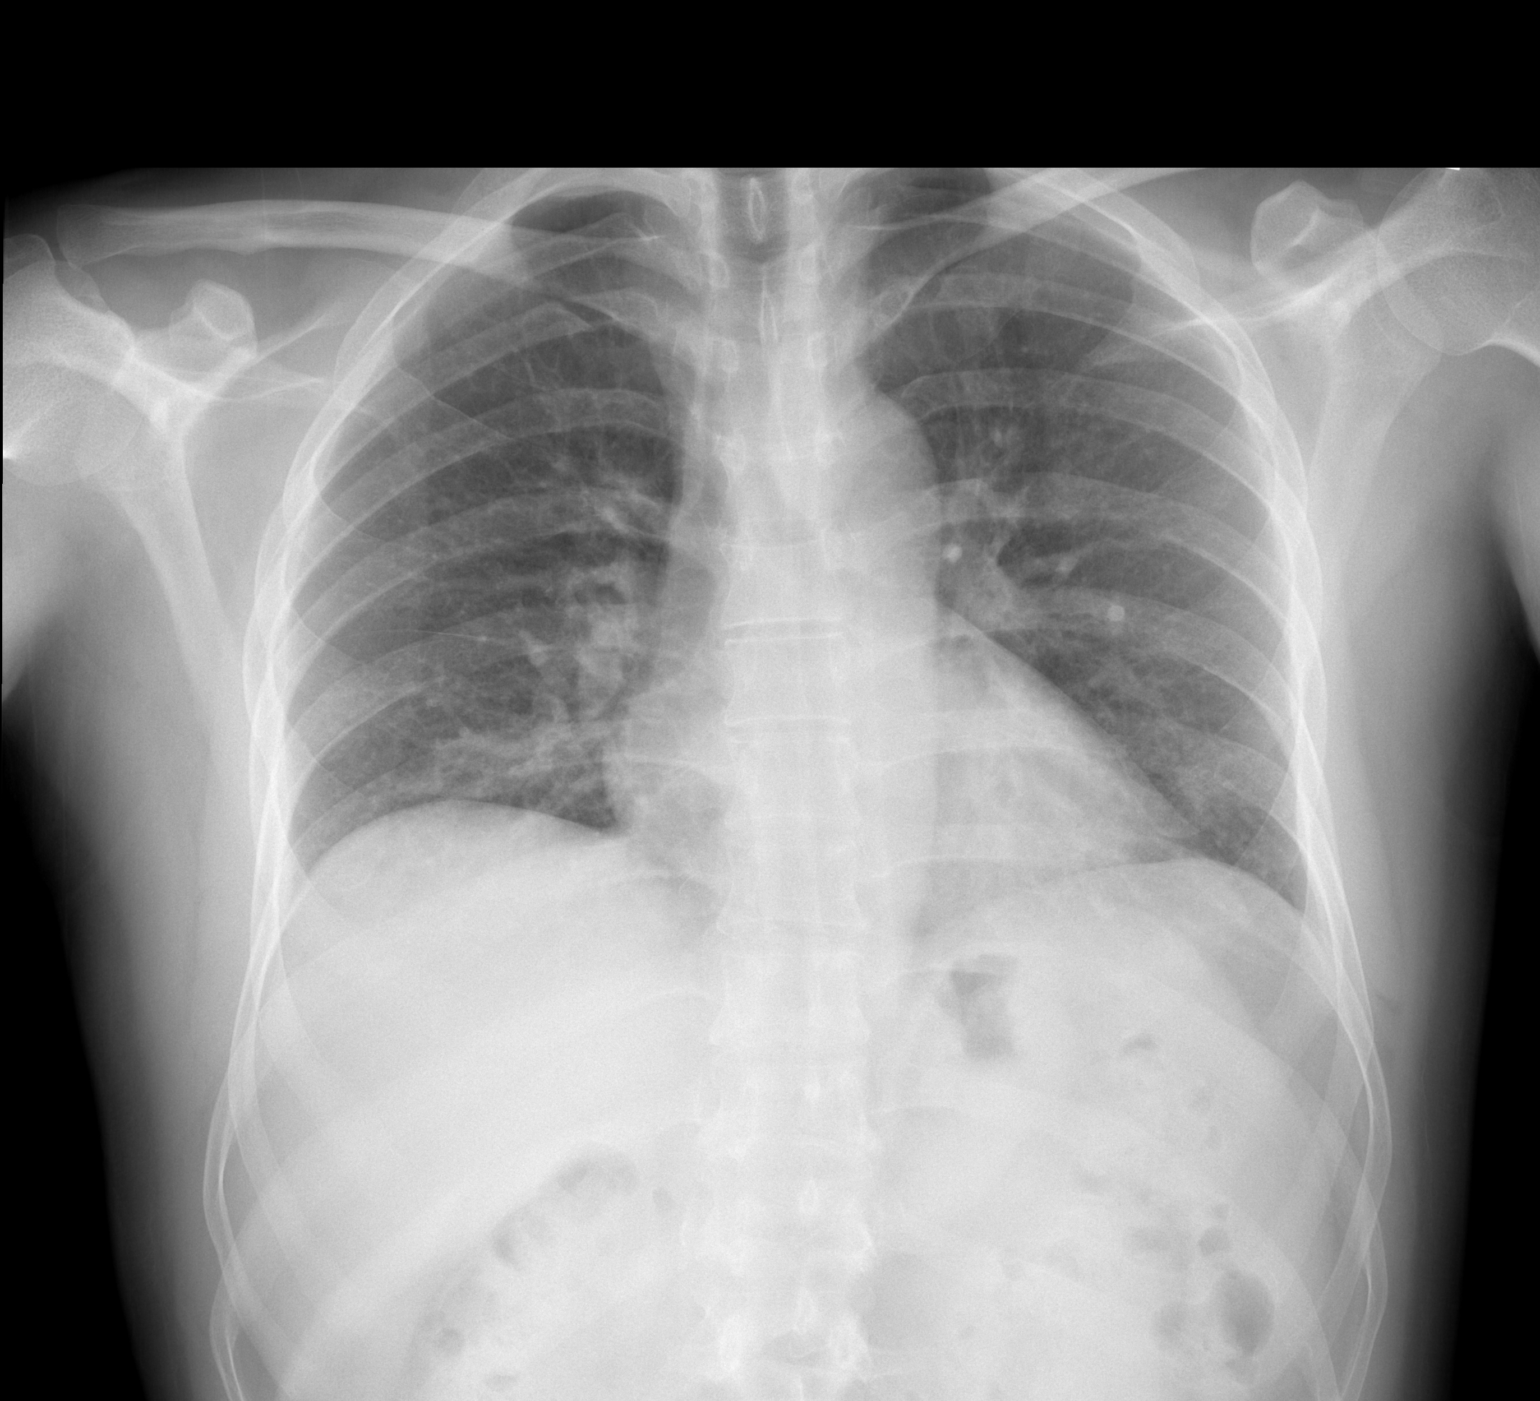

[1 of 1 positions shown; findings below may reference images not displayed]

FINDINGS: Mediastinum hilar structures normal. Low lung volumes with bibasilar
subsegmental atelectasis. Mild infiltrates in the lung bases cannot
be completely excluded. No pleural effusion or pneumothorax. Heart
size normal. No acute bony abnormality .
IMPRESSION: Lung volumes with mild bibasilar subsegmental atelectasis. Mild
infiltrates in the lung bases cannot be excluded . Follow-up exam
can be obtained for continued evaluation

## 2019-12-11 ENCOUNTER — Other Ambulatory Visit: Payer: Self-pay | Admitting: *Deleted

## 2019-12-11 DIAGNOSIS — Z20822 Contact with and (suspected) exposure to covid-19: Secondary | ICD-10-CM

## 2019-12-12 LAB — NOVEL CORONAVIRUS, NAA: SARS-CoV-2, NAA: NOT DETECTED

## 2020-12-17 ENCOUNTER — Encounter: Payer: Self-pay | Admitting: Critical Care Medicine

## 2020-12-17 ENCOUNTER — Ambulatory Visit: Payer: Self-pay | Attending: Critical Care Medicine | Admitting: Critical Care Medicine

## 2020-12-17 ENCOUNTER — Other Ambulatory Visit: Payer: Self-pay

## 2020-12-17 VITALS — BP 125/75 | HR 68 | Resp 16 | Wt 139.0 lb

## 2020-12-17 DIAGNOSIS — K029 Dental caries, unspecified: Secondary | ICD-10-CM

## 2020-12-17 DIAGNOSIS — Z139 Encounter for screening, unspecified: Secondary | ICD-10-CM

## 2020-12-17 DIAGNOSIS — Z1159 Encounter for screening for other viral diseases: Secondary | ICD-10-CM

## 2020-12-17 DIAGNOSIS — H524 Presbyopia: Secondary | ICD-10-CM

## 2020-12-17 DIAGNOSIS — Z1211 Encounter for screening for malignant neoplasm of colon: Secondary | ICD-10-CM

## 2020-12-17 NOTE — Progress Notes (Signed)
Subjective:    Patient ID: Dan Mann, male    DOB: Feb 08, 1971, 50 y.o.   MRN: [id number]  49 y.o.M here to est PCP  12/17/2020 This a 50 year old male seen for the first time in our clinic.  He was a former primary care patient of Dr. Adrian Blackwater but has not been seen here since 2018.  Prior history of heavy alcohol use he states he is no longer drinking alcohol.  Complains of increased presbyopia.  But he has no other complaints at this time and has no chronic medical conditions.  He is here for a well visit check.  He states he is not receive the COVID vaccine and is steadfast against this vaccine.   Past Medical History:  Diagnosis Date  . Family history of diabetes mellitus (DM) 07/10/2014     Family History  Problem Relation Age of Onset  . Diabetes Mother   . Hypertension Mother   . Cancer Neg Hx   . Heart disease Neg Hx      Social History   Socioeconomic History  . Marital status: Married    Spouse name: Roanna Epley   . Number of children: 3  . Years of education: Not on file  . Highest education level: Not on file  Occupational History  . Occupation: Engineer, maintenance (IT)   Tobacco Use  . Smoking status: Former Smoker    Packs/day: 0.25    Years: 0.50    Pack years: 0.12    Types: Cigarettes    Quit date: 07/10/1994    Years since quitting: 26.4  . Smokeless tobacco: Never Used  Substance and Sexual Activity  . Alcohol use: Yes    Alcohol/week: 42.0 standard drinks    Types: 42 Cans of beer per week    Comment: 4-6 beers a day   . Drug use: No  . Sexual activity: Yes    Birth control/protection: None  Other Topics Concern  . Not on file  Social History Narrative   Lives at home with partner and 4 children.   3 biological children 14, 6, 4.    Social Determinants of Health   Financial Resource Strain: Not on file  Food Insecurity: Not on file  Transportation Needs: Not on file  Physical Activity: Not on file  Stress: Not on file  Social  Connections: Not on file  Intimate Partner Violence: Not on file     No Known Allergies   Outpatient Medications Prior to Visit  Medication Sig Dispense Refill  . diclofenac sodium (VOLTAREN) 1 % GEL Apply 2 g 4 (four) times daily topically. Apply to the back of your leg where you have pain. (Patient not taking: Reported on 12/17/2020) 1 Tube 0  . ibuprofen (ADVIL,MOTRIN) 600 MG tablet Take 1 tablet (600 mg total) every 6 (six) hours as needed by mouth. (Patient not taking: Reported on 12/17/2020) 20 tablet 0   No facility-administered medications prior to visit.     Review of Systems  HENT: Positive for dental problem. Negative for congestion, nosebleeds, sinus pressure, sinus pain, sore throat and tinnitus.   Eyes: Positive for visual disturbance.       Presbyopia  Respiratory: Negative.  Negative for apnea, cough, choking, chest tightness, shortness of breath, wheezing and stridor.   Cardiovascular: Positive for chest pain.       Right upper chest   Gastrointestinal: Negative.   Genitourinary: Negative.   Musculoskeletal: Negative.   Skin: Negative.   Neurological: Negative.  Psychiatric/Behavioral: Negative.        Objective:   Physical Exam  Vitals:   12/17/20 0847  BP: 125/75  Pulse: 68  Resp: 16  SpO2: 98%  Weight: 139 lb (63 kg)    Gen: Pleasant, well-nourished, in no distress,  normal affect  ENT: No lesions,  mouth clear,  oropharynx clear, no postnasal drip  Neck: No JVD, no TMG, no carotid bruits  Lungs: No use of accessory muscles, no dullness to percussion, clear without rales or rhonchi  Cardiovascular: RRR, heart sounds normal, no murmur or gallops, no peripheral edema  Abdomen: soft and NT, no HSM,  BS normal  Musculoskeletal: No deformities, no cyanosis or clubbing  Neuro: alert, non focal  Skin: Warm, no lesions or rashes  No results found.      Assessment & Plan:  I personally reviewed all images and lab data in the [facility] system as well  as any outside material available during this office visit and agree with the  radiology impressions.   Encounter for health-related screening No evidence of chronic medical conditions as of yet  Complete set of health screening labs are sent including a colon cancer screening kit and also will check for hepatitis C  Dental cavities Dental cavity still seen dental resources given to the patient  Presbyopia of both eyes Difficulty with close accommodation in vision  Resources given for him to obtain an eye exam   Christepher was seen today for establish care.  Diagnoses and all orders for this visit:  Encounter for health-related screening -     Comprehensive metabolic panel -     CBC with Differential/Platelet -     Hemoglobin A1c -     Lipid panel  Colon cancer screening -     Fecal occult blood, imunochemical  Need for hepatitis C screening test -     HCV Ab w Reflex to Quant PCR  Dental cavities  Presbyopia of both eyes  I spent 10 minutes of this visit telling him the necessity for COVID vaccine I am not sure he will follow through

## 2020-12-17 NOTE — Assessment & Plan Note (Signed)
Dental cavity still seen dental resources given to the patient

## 2020-12-17 NOTE — Progress Notes (Signed)
New patient

## 2020-12-17 NOTE — Patient Instructions (Signed)
No medications at this visit  You will stop by the lab for general screening labs and a colon cancer kit to take home and get a sample stool bring back  We discussed the importance of a COVID vaccine and I realize you were still hesitant to please reconsider we will discuss this again at the next visit    Return in 6 months sooner if necessary   No hay medicamentos en esta visita  Pasar por el laboratorio para exmenes de laboratorio generales y un kit de cncer de colon para llevar a casa y obtener una muestra de heces.  Discutimos la importancia de una vacuna COVID y me doy cuenta de que todava dudaba en reconsiderar. Hablaremos de esto nuevamente en la prxima visita.    Devolucin en 6 meses antes si es necesario

## 2020-12-17 NOTE — Assessment & Plan Note (Signed)
No evidence of chronic medical conditions as of yet  Complete set of health screening labs are sent including a colon cancer screening kit and also will check for hepatitis C

## 2020-12-17 NOTE — Assessment & Plan Note (Signed)
Difficulty with close accommodation in vision  Resources given for him to obtain an eye exam

## 2020-12-18 ENCOUNTER — Encounter: Payer: Self-pay | Admitting: *Deleted

## 2020-12-18 LAB — LIPID PANEL
Chol/HDL Ratio: 6.2 ratio — ABNORMAL HIGH (ref 0.0–5.0)
Cholesterol, Total: 247 mg/dL — ABNORMAL HIGH (ref 100–199)
HDL: 40 mg/dL (ref >39)
LDL Chol Calc (NIH): 182 mg/dL — ABNORMAL HIGH (ref 0–99)
Triglycerides: 135 mg/dL (ref 0–149)
VLDL Cholesterol Cal: 25 mg/dL (ref 5–40)

## 2020-12-18 LAB — CBC WITH DIFFERENTIAL/PLATELET
Basophils Absolute: 0 10*3/uL (ref 0.0–0.2)
Basos: 1 %
EOS (ABSOLUTE): 0.2 10*3/uL (ref 0.0–0.4)
Eos: 3 %
Hematocrit: 42.4 % (ref 37.5–51.0)
Hemoglobin: 14.5 g/dL (ref 13.0–17.7)
Immature Grans (Abs): 0 10*3/uL (ref 0.0–0.1)
Immature Granulocytes: 1 %
Lymphocytes Absolute: 1.3 10*3/uL (ref 0.7–3.1)
Lymphs: 23 %
MCH: 28.6 pg (ref 26.6–33.0)
MCHC: 34.2 g/dL (ref 31.5–35.7)
MCV: 84 fL (ref 79–97)
Monocytes Absolute: 0.5 10*3/uL (ref 0.1–0.9)
Monocytes: 9 %
Neutrophils Absolute: 3.7 10*3/uL (ref 1.4–7.0)
Neutrophils: 63 %
Platelets: 216 10*3/uL (ref 150–450)
RBC: 5.07 x10E6/uL (ref 4.14–5.80)
RDW: 13.1 % (ref 11.6–15.4)
WBC: 5.7 10*3/uL (ref 3.4–10.8)

## 2020-12-18 LAB — COMPREHENSIVE METABOLIC PANEL
ALT: 27 IU/L (ref 0–44)
AST: 19 IU/L (ref 0–40)
Albumin/Globulin Ratio: 1.7 (ref 1.2–2.2)
Albumin: 4.6 g/dL (ref 4.0–5.0)
Alkaline Phosphatase: 98 IU/L (ref 44–121)
BUN/Creatinine Ratio: 22 — ABNORMAL HIGH (ref 9–20)
BUN: 17 mg/dL (ref 6–24)
Bilirubin Total: 0.2 mg/dL (ref 0.0–1.2)
CO2: 22 mmol/L (ref 20–29)
Calcium: 9.1 mg/dL (ref 8.7–10.2)
Chloride: 103 mmol/L (ref 96–106)
Creatinine, Ser: 0.76 mg/dL (ref 0.76–1.27)
GFR calc Af Amer: 126 mL/min/{1.73_m2} (ref >59)
GFR calc non Af Amer: 109 mL/min/{1.73_m2} (ref >59)
Globulin, Total: 2.7 g/dL (ref 1.5–4.5)
Glucose: 102 mg/dL — ABNORMAL HIGH (ref 65–99)
Potassium: 4.3 mmol/L (ref 3.5–5.2)
Sodium: 139 mmol/L (ref 134–144)
Total Protein: 7.3 g/dL (ref 6.0–8.5)

## 2020-12-18 LAB — HEMOGLOBIN A1C
Est. average glucose Bld gHb Est-mCnc: 120 mg/dL
Hgb A1c MFr Bld: 5.8 % — ABNORMAL HIGH (ref 4.8–5.6)

## 2020-12-22 ENCOUNTER — Other Ambulatory Visit: Payer: Self-pay

## 2020-12-22 ENCOUNTER — Ambulatory Visit: Payer: Self-pay | Attending: Critical Care Medicine

## 2020-12-24 ENCOUNTER — Ambulatory Visit: Payer: Self-pay

## 2020-12-26 LAB — FECAL OCCULT BLOOD, IMMUNOCHEMICAL: Fecal Occult Bld: NEGATIVE

## 2020-12-30 ENCOUNTER — Encounter: Payer: Self-pay | Admitting: *Deleted
